# Patient Record
Sex: Male | Born: 1995 | Race: White | Hispanic: No | Marital: Single | Smoking: Never smoker
Health system: Southern US, Community
[De-identification: ages and names within clinical notes are randomized; demographics above are authoritative.]

## PROBLEM LIST (undated history)

## (undated) DIAGNOSIS — T7840XA Allergy, unspecified, initial encounter: Secondary | ICD-10-CM

## (undated) DIAGNOSIS — G4730 Sleep apnea, unspecified: Secondary | ICD-10-CM

## (undated) DIAGNOSIS — K219 Gastro-esophageal reflux disease without esophagitis: Secondary | ICD-10-CM

## (undated) DIAGNOSIS — Z973 Presence of spectacles and contact lenses: Secondary | ICD-10-CM

## (undated) DIAGNOSIS — J029 Acute pharyngitis, unspecified: Secondary | ICD-10-CM

## (undated) HISTORY — DX: Sleep apnea, unspecified: G47.30

## (undated) HISTORY — PX: HERNIA REPAIR: SHX51

## (undated) HISTORY — DX: Allergy, unspecified, initial encounter: T78.40XA

## (undated) HISTORY — PX: FRACTURE SURGERY: SHX138

---
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
## 2015-12-26 ENCOUNTER — Encounter: Payer: Self-pay | Admitting: Family Medicine

## 2015-12-26 ENCOUNTER — Ambulatory Visit (INDEPENDENT_AMBULATORY_CARE_PROVIDER_SITE_OTHER): Payer: 59 | Admitting: Family Medicine

## 2015-12-26 VITALS — BP 122/80 | HR 80 | Ht 70.0 in | Wt 222.0 lb

## 2015-12-26 DIAGNOSIS — R5383 Other fatigue: Secondary | ICD-10-CM | POA: Diagnosis not present

## 2015-12-26 DIAGNOSIS — J029 Acute pharyngitis, unspecified: Secondary | ICD-10-CM | POA: Diagnosis not present

## 2015-12-26 DIAGNOSIS — R5381 Other malaise: Secondary | ICD-10-CM

## 2015-12-26 LAB — POCT INFLUENZA A/B
Influenza A, POC: NEGATIVE
Influenza B, POC: NEGATIVE

## 2015-12-26 MED ORDER — DOXYCYCLINE HYCLATE 100 MG PO TABS: 100.0000 mg | ORAL_TABLET | Freq: Two times a day (BID) | ORAL | Status: DC

## 2015-12-26 NOTE — Progress Notes (Signed)
Name: Randy Farrell   MRN: [id number]    DOB: [birth year]   Date:12/26/2015       Progress Note  Subjective  Chief Complaint  Chief Complaint  Patient presents with  . Sore Throat    taking allergy med- not helping/ nasal congestion, clear production, sweating at night    Sore Throat  This is a new problem. The current episode started in the past 7 days. The problem has been gradually improving. The maximum temperature recorded prior to his arrival was 100.4 - 100.9 F. Associated symptoms include congestion. Pertinent negatives include no abdominal pain, coughing, diarrhea, ear discharge, ear pain, headaches, hoarse voice, plugged ear sensation, neck pain, shortness of breath, stridor, swollen glands, trouble swallowing or vomiting. He has had no exposure to strep or mono. He has tried nothing for the symptoms. The treatment provided mild relief.  Thyroid Problem Presents for initial visit. Symptoms include fatigue. Patient reports no anxiety, cold intolerance, constipation, depressed mood, diarrhea, hair loss, heat intolerance, hoarse voice, leg swelling, menstrual problem, nail problem, palpitations, tremors, visual change, weight gain or weight loss. The symptoms have been stable. Past treatments include nothing. There is no history of atrial fibrillation, dementia, Graves' ophthalmopathy, heart failure, hyperlipidemia, neuropathy, obesity or osteopenia.    No problem-specific assessment & plan notes found for this encounter.   History reviewed. No pertinent past medical history.  Past Surgical History  Procedure Laterality Date  . Foot fracture surgery Right     History reviewed. No pertinent family history.  Social History   Social History  . Marital Status: Unknown    Spouse Name: N/A  . Number of Children: N/A  . Years of Education: N/A   Occupational History  . Not on file.   Social History Main Topics  . Smoking status: Never Smoker   . Smokeless tobacco: Not on  file  . Alcohol Use: No  . Drug Use: No  . Sexual Activity: Yes   Other Topics Concern  . Not on file   Social History Narrative  . No narrative on file    No Known Allergies   Review of Systems  Constitutional: Positive for fatigue. Negative for fever, chills, weight loss, weight gain and malaise/fatigue.  HENT: Positive for congestion. Negative for ear discharge, ear pain, hoarse voice, sore throat and trouble swallowing.   Eyes: Negative for blurred vision.  Respiratory: Negative for cough, sputum production, shortness of breath, wheezing and stridor.   Cardiovascular: Negative for chest pain, palpitations and leg swelling.  Gastrointestinal: Negative for heartburn, nausea, vomiting, abdominal pain, diarrhea, constipation, blood in stool and melena.  Genitourinary: Negative for dysuria, urgency, frequency, hematuria and menstrual problem.  Musculoskeletal: Negative for myalgias, back pain, joint pain and neck pain.  Skin: Negative for itching and rash.  Neurological: Negative for dizziness, tingling, tremors, sensory change, focal weakness and headaches.  Endo/Heme/Allergies: Negative for environmental allergies, cold intolerance, heat intolerance and polydipsia. Does not bruise/bleed easily.  Psychiatric/Behavioral: Negative for depression and suicidal ideas. The patient is not nervous/anxious and does not have insomnia.      Objective  Filed Vitals:   12/26/15 1423  BP: 122/80  Pulse: 80  Height: 5\' 10"  (1.778 m)  Weight: 222 lb (100.699 kg)    Physical Exam  Constitutional: He is oriented to person, place, and time and well-developed, well-nourished, and in no distress.  HENT:  Head: Normocephalic.  Right Ear: Tympanic membrane, external ear and ear canal normal.  Left Ear: Tympanic membrane, external  ear and ear canal normal.  Nose: Nose normal. Right sinus exhibits no maxillary sinus tenderness and no frontal sinus tenderness. Left sinus exhibits no maxillary  sinus tenderness and no frontal sinus tenderness.  Mouth/Throat: Posterior oropharyngeal erythema present. No posterior oropharyngeal edema or tonsillar abscesses.  Eyes: Conjunctivae and EOM are normal. Pupils are equal, round, and reactive to light. Right eye exhibits no discharge. Left eye exhibits no discharge. No scleral icterus.  Neck: Normal range of motion. Neck supple. No JVD present. No tracheal deviation present. No thyromegaly present.  Cardiovascular: Normal rate, regular rhythm, normal heart sounds and intact distal pulses.  Exam reveals no gallop and no friction rub.   No murmur heard. Pulmonary/Chest: Breath sounds normal. No respiratory distress. He has no wheezes. He has no rales.  Abdominal: Soft. Bowel sounds are normal. He exhibits no mass. There is no hepatosplenomegaly, splenomegaly or hepatomegaly. There is no tenderness. There is no rebound, no guarding and no CVA tenderness.  Musculoskeletal: Normal range of motion. He exhibits no edema or tenderness.  Lymphadenopathy:       Head (right side): No submental and no submandibular adenopathy present.       Head (left side): No submental and no submandibular adenopathy present.    He has cervical adenopathy.       Right cervical: Posterior cervical adenopathy present.       Left cervical: Posterior cervical adenopathy present.  Neurological: He is alert and oriented to person, place, and time. He has normal sensation, normal strength, normal reflexes and intact cranial nerves. No cranial nerve deficit.  Skin: Skin is warm. No rash noted.  Psychiatric: Mood and affect normal.  Nursing note and vitals reviewed.     Assessment & Plan  Problem List Items Addressed This Visit    None    Visit Diagnoses    Pharyngitis    -  Primary    Relevant Medications    doxycycline (VIBRA-TABS) 100 MG tablet    Other Relevant Orders    POCT Influenza A/B (Completed)    Malaise and fatigue        Relevant Medications     doxycycline (VIBRA-TABS) 100 MG tablet    Other Relevant Orders    Monospot    HIV antibody    POCT Influenza A/B (Completed)         Dr. Hayden [facility]  12/26/2015

## 2015-12-27 ENCOUNTER — Telehealth: Payer: Self-pay

## 2015-12-27 LAB — HIV ANTIBODY (ROUTINE TESTING W REFLEX): HIV Screen 4th Generation wRfx: NONREACTIVE

## 2015-12-27 LAB — MONONUCLEOSIS SCREEN: Mono Screen: NEGATIVE

## 2015-12-27 NOTE — Telephone Encounter (Signed)
Spoke with patient. Patient advised of all results and verbalized understanding. Will call back with any future questions or concerns. MAH  

## 2015-12-27 NOTE — Telephone Encounter (Signed)
-----   Message from Duanne Limerickeanna C Jones, MD sent at 12/27/2015  8:00 AM EDT ----- HIV negative/ awaiting mono

## 2015-12-27 NOTE — Telephone Encounter (Signed)
-----   Message from Duanne Limerickeanna C Jones, MD sent at 12/27/2015  4:31 PM EDT ----- No mononucleosis

## 2017-09-15 ENCOUNTER — Other Ambulatory Visit: Payer: Self-pay

## 2017-09-15 ENCOUNTER — Ambulatory Visit
Admission: EM | Admit: 2017-09-15 | Discharge: 2017-09-15 | Disposition: A | Discharge status: Home / Self Care | Payer: BLUE CROSS/BLUE SHIELD | Attending: Family Medicine | Admitting: Family Medicine

## 2017-09-15 ENCOUNTER — Ambulatory Visit: Admission: EM | Admit: 2017-09-15 | Discharge: 2017-09-15 | Discharge status: Absent without leave | Payer: Self-pay

## 2017-09-15 DIAGNOSIS — Z7722 Contact with and (suspected) exposure to environmental tobacco smoke (acute) (chronic): Secondary | ICD-10-CM | POA: Insufficient documentation

## 2017-09-15 DIAGNOSIS — R079 Chest pain, unspecified: Secondary | ICD-10-CM | POA: Diagnosis not present

## 2017-09-15 DIAGNOSIS — Z79899 Other long term (current) drug therapy: Secondary | ICD-10-CM | POA: Insufficient documentation

## 2017-09-15 DIAGNOSIS — R042 Hemoptysis: Secondary | ICD-10-CM | POA: Diagnosis not present

## 2017-09-15 DIAGNOSIS — R0789 Other chest pain: Secondary | ICD-10-CM | POA: Insufficient documentation

## 2017-09-15 DIAGNOSIS — K219 Gastro-esophageal reflux disease without esophagitis: Secondary | ICD-10-CM

## 2017-09-15 NOTE — Discharge Instructions (Signed)
Recommend otc PPI, dietary modifications, referrral by PCP to GI for further evaluation/possible endoscopy

## 2017-09-15 NOTE — ED Provider Notes (Signed)
[facility]    CSN: [id number] Arrival date & time: 09/15/17  [id number]     History   Chief Complaint Chief Complaint  Patient presents with  . Chest Pain    HPI Randy Farrell is a 21 y.o. male.   The history is provided by the patient.  Chest Pain  Pain location:  Epigastric and substernal area Pain quality: burning and sharp   Pain radiates to:  Does not radiate Pain severity:  Mild Onset quality:  Sudden Duration:  2 months Timing:  Intermittent Progression:  Waxing and waning Chronicity:  Recurrent Context: not breathing, not drug use, not eating, not intercourse, not lifting, not movement, not raising an arm, not at rest, not stress and not trauma   Relieved by:  Nothing Worsened by:  Nothing Associated symptoms: heartburn   Associated symptoms: no AICD problem, no altered mental status, no anorexia, no anxiety, no back pain, no claudication, no cough, no diaphoresis, no dizziness, no dysphagia, no fatigue, no fever, no headache, no lower extremity edema, no nausea, no near-syncope, no numbness, no orthopnea, no palpitations, no PND, no shortness of breath, no syncope, no vomiting and no weakness   Associated symptoms comment:  States in the past has had episodes of vomiting "a little bit of blood" Risk factors: no aortic disease, no birth control, no coronary artery disease, no diabetes mellitus, no Ehlers-Danlos syndrome, no high cholesterol, no hypertension, no immobilization, not male, no Marfan's syndrome, not obese, not pregnant, no prior DVT/PE, no smoking and no surgery     History reviewed. No pertinent past medical history.  There are no active problems to display for this patient.   Past Surgical History:  Procedure Laterality Date  . FOOT FRACTURE SURGERY Right        Home Medications    Prior to Admission medications   Medication Sig Start Date End Date Taking? Authorizing Provider  doxycycline (VIBRA-TABS) 100 MG tablet Take 1 tablet  (100 mg total) by mouth 2 (two) times daily. 12/26/15   Duanne LimerickJones, Deanna C, MD    Family History History reviewed. No pertinent family history.  Social History Social History   Tobacco Use  . Smoking status: Passive Smoke Exposure - Never Smoker  . Smokeless tobacco: Never Used  Substance Use Topics  . Alcohol use: No    Alcohol/week: 0.0 oz  . Drug use: No     Allergies   Patient has no known allergies.   Review of Systems Review of Systems  Constitutional: Negative for diaphoresis, fatigue and fever.  HENT: Negative for trouble swallowing.   Respiratory: Negative for cough and shortness of breath.   Cardiovascular: Positive for chest pain. Negative for palpitations, orthopnea, claudication, syncope, PND and near-syncope.  Gastrointestinal: Positive for heartburn. Negative for anorexia, nausea and vomiting.  Musculoskeletal: Negative for back pain.  Neurological: Negative for dizziness, weakness, numbness and headaches.     Physical Exam Triage Vital Signs ED Triage Vitals  Enc Vitals Group     BP 09/15/17 0841 127/80     Pulse Rate 09/15/17 0841 77     Resp 09/15/17 0841 18     Temp 09/15/17 0841 98.7 F (37.1 C)     Temp Source 09/15/17 0841 Oral     SpO2 09/15/17 0841 99 %     Weight 09/15/17 0838 230 lb (104.3 kg)     Height 09/15/17 0838 5\' 11"  (1.803 m)     Head Circumference --  Peak Flow --      Pain Score 09/15/17 0838 2     Pain Loc --      Pain Edu? --      Excl. in GC? --    No data found.  Updated Vital Signs BP 127/80 (BP Location: Left Arm)   Pulse 77   Temp 98.7 F (37.1 C) (Oral)   Resp 18   Ht 5\' 11"  (1.803 m)   Wt 230 lb (104.3 kg)   SpO2 99%   BMI 32.08 kg/m   Visual Acuity Right Eye Distance:   Left Eye Distance:   Bilateral Distance:    Right Eye Near:   Left Eye Near:    Bilateral Near:     Physical Exam  Constitutional: He is oriented to person, place, and time. He appears well-developed and well-nourished. No  distress.  HENT:  Head: Normocephalic and atraumatic.  Cardiovascular: Normal rate, regular rhythm, normal heart sounds and intact distal pulses.  No murmur heard. Pulmonary/Chest: Effort normal and breath sounds normal. No stridor. No respiratory distress. He has no wheezes. He has no rales.  Abdominal: Soft. Bowel sounds are normal. He exhibits no distension and no mass. There is tenderness (mild, epigastric; no rebound or guarding). There is no rebound and no guarding.  Neurological: He is alert and oriented to person, place, and time.  Skin: No rash noted. He is not diaphoretic.  Nursing note and vitals reviewed.    UC Treatments / Results  Labs (all labs ordered are listed, but only abnormal results are displayed) Labs Reviewed - No data to display  EKG  EKG Interpretation None       Radiology No results found.  Procedures ED EKG Date/Time: 09/15/2017 10:05 AM Performed by: Payton Mccallumonty, Shantanu Strauch, MD Authorized by: Payton Mccallumonty, Ligaya Cormier, MD   ECG reviewed by ED Physician in the absence of a cardiologist: yes   Previous ECG:    Previous ECG:  Unavailable Interpretation:    Interpretation: normal   Rate:    ECG rate assessment: normal   Rhythm:    Rhythm: sinus rhythm   Ectopy:    Ectopy: none   QRS:    QRS axis:  Normal Conduction:    Conduction: normal   ST segments:    ST segments:  Normal T waves:    T waves: normal      (including critical care time)  Medications Ordered in UC Medications - No data to display   Initial Impression / Assessment and Plan / UC Course  I have reviewed the triage vital signs and the nursing notes.  Pertinent labs & imaging results that were available during my care of the patient were reviewed by me and considered in my medical decision making (see chart for details).       Final Clinical Impressions(s) / UC Diagnoses   Final diagnoses:  Gastroesophageal reflux disease, esophagitis presence not specified  Other chest pain    Hemoptysis    ED Discharge Orders    None     1. ekg result (normal) and diagnosis reviewed with patient 2.. Recommend supportive treatment with otc PPI, dietary modifications, referral by PCP to GI for further evaluation/possible endoscopy 4. To ED if develops hemoptysis recurs or develops melena 5.  Follow-up prn if symptoms worsen or don't improve  Controlled Substance Prescriptions [place] Controlled Substance Registry consulted? Not Applicable   Payton Mccallumonty, Ahlam Piscitelli, MD 09/15/17 1058

## 2017-09-15 NOTE — ED Triage Notes (Signed)
Patient complains of left sided chest dullness x "few years". Patient states that at times he has a sharp pain that will stop him from moving. Patient reports that he has been having a tightness in his upper abdomen with acid reflux recently. Patient reports vomiting blood this morning.

## 2017-09-19 ENCOUNTER — Encounter: Payer: Self-pay | Admitting: Family Medicine

## 2017-09-19 ENCOUNTER — Ambulatory Visit: Payer: BLUE CROSS/BLUE SHIELD | Admitting: Family Medicine

## 2017-09-19 VITALS — BP 138/78 | HR 80 | Ht 71.0 in | Wt 234.0 lb

## 2017-09-19 DIAGNOSIS — K219 Gastro-esophageal reflux disease without esophagitis: Secondary | ICD-10-CM | POA: Diagnosis not present

## 2017-09-19 DIAGNOSIS — R1310 Dysphagia, unspecified: Secondary | ICD-10-CM

## 2017-09-19 DIAGNOSIS — R1319 Other dysphagia: Secondary | ICD-10-CM

## 2017-09-19 MED ORDER — PANTOPRAZOLE SODIUM 40 MG PO TBEC: 40.0000 mg | DELAYED_RELEASE_TABLET | Freq: Every day | ORAL | 3 refills | Status: DC

## 2017-09-19 NOTE — Progress Notes (Signed)
Name: Randy Farrell   MRN: [id number]    DOB: [birth year]   Date:09/19/2017       Progress Note  Subjective  Chief Complaint  Chief Complaint  Patient presents with  . Gastroesophageal Reflux    greasy foods, soda causes burning/ GERD. Feels like food is stuck, but not choking- has tried famotidine- helps some    Gastroesophageal Reflux  He complains of belching, dysphagia, globus sensation, heartburn and a sore throat. He reports no abdominal pain, no chest pain, no choking, no coughing, no early satiety, no hoarse voice, no nausea, no stridor, no tooth decay, no water brash or no wheezing. This is a new problem. The current episode started more than 1 year ago. The problem occurs frequently. The problem has been waxing and waning. The heartburn duration is several minutes. The heartburn is located in the substernum. The symptoms are aggravated by lying down, certain foods and caffeine (soda). Pertinent negatives include no anemia, fatigue, melena, muscle weakness, orthopnea or weight loss. Risk factors include obesity. Treatments tried: fitomine. The treatment provided mild relief.    No problem-specific Assessment & Plan notes found for this encounter.   History reviewed. No pertinent past medical history.  Past Surgical History:  Procedure Laterality Date  . FOOT FRACTURE SURGERY Right     History reviewed. No pertinent family history.  Social History   Socioeconomic History  . Marital status: Unknown    Spouse name: Not on file  . Number of children: Not on file  . Years of education: Not on file  . Highest education level: Not on file  Social Needs  . Financial resource strain: Not on file  . Food insecurity - worry: Not on file  . Food insecurity - inability: Not on file  . Transportation needs - medical: Not on file  . Transportation needs - non-medical: Not on file  Occupational History  . Not on file  Tobacco Use  . Smoking status: Passive Smoke Exposure - Never  Smoker  . Smokeless tobacco: Never Used  Substance and Sexual Activity  . Alcohol use: No    Alcohol/week: 0.0 oz  . Drug use: No  . Sexual activity: Yes  Other Topics Concern  . Not on file  Social History Narrative  . Not on file    No Known Allergies  Outpatient Medications Prior to Visit  Medication Sig Dispense Refill  . doxycycline (VIBRA-TABS) 100 MG tablet Take 1 tablet (100 mg total) by mouth 2 (two) times daily. 20 tablet 0   No facility-administered medications prior to visit.     Review of Systems  Constitutional: Negative for chills, fatigue, fever, malaise/fatigue and weight loss.  HENT: Positive for sore throat. Negative for ear discharge, ear pain and hoarse voice.   Eyes: Negative for blurred vision.  Respiratory: Negative for cough, sputum production, choking, shortness of breath and wheezing.   Cardiovascular: Negative for chest pain, palpitations and leg swelling.  Gastrointestinal: Positive for dysphagia and heartburn. Negative for abdominal pain, blood in stool, constipation, diarrhea, melena and nausea.  Genitourinary: Negative for dysuria, frequency, hematuria and urgency.  Musculoskeletal: Negative for back pain, joint pain, myalgias, muscle weakness and neck pain.  Skin: Negative for rash.  Neurological: Negative for dizziness, tingling, sensory change, focal weakness and headaches.  Endo/Heme/Allergies: Negative for environmental allergies and polydipsia. Does not bruise/bleed easily.  Psychiatric/Behavioral: Negative for depression and suicidal ideas. The patient is not nervous/anxious and does not have insomnia.      Objective  Vitals:   09/19/17 1334  BP: 138/78  Pulse: 80  Weight: 234 lb (106.1 kg)  Height: 5\' 11"  (1.803 m)    Physical Exam  Constitutional: He is oriented to person, place, and time and well-developed, well-nourished, and in no distress.  HENT:  Head: Normocephalic.  Right Ear: External ear normal.  Left Ear: External  ear normal.  Nose: Nose normal.  Mouth/Throat: Oropharynx is clear and moist.  Eyes: Conjunctivae and EOM are normal. Pupils are equal, round, and reactive to light. Right eye exhibits no discharge. Left eye exhibits no discharge. No scleral icterus.  Neck: Normal range of motion. Neck supple. No JVD present. No tracheal deviation present. No thyromegaly present.  Cardiovascular: Normal rate, regular rhythm, normal heart sounds and intact distal pulses. Exam reveals no gallop and no friction rub.  No murmur heard. Pulmonary/Chest: Breath sounds normal. No respiratory distress. He has no wheezes. He has no rales.  Abdominal: Soft. Bowel sounds are normal. He exhibits no mass. There is no hepatosplenomegaly. There is no tenderness. There is no rebound, no guarding and no CVA tenderness.  Musculoskeletal: Normal range of motion. He exhibits no edema or tenderness.  Lymphadenopathy:    He has no cervical adenopathy.  Neurological: He is alert and oriented to person, place, and time. He has normal sensation, normal strength and intact cranial nerves. No cranial nerve deficit.  Skin: Skin is warm. No rash noted.  Psychiatric: Mood and affect normal.  Nursing note and vitals reviewed.     Assessment & Plan  Problem List Items Addressed This Visit    None    Visit Diagnoses    Gastroesophageal reflux disease, esophagitis presence not specified    -  Primary   Relevant Medications   pantoprazole (PROTONIX) 40 MG tablet   Esophageal dysphagia       Relevant Medications   pantoprazole (PROTONIX) 40 MG tablet   Other Relevant Orders   Ambulatory referral to Gastroenterology      Meds ordered this encounter  Medications  . pantoprazole (PROTONIX) 40 MG tablet    Sig: Take 1 tablet (40 mg total) by mouth daily.    Dispense:  30 tablet    Refill:  3      Dr. Hayden Rasmusseneanna Elisa Kutner [facility]  09/19/17

## 2017-09-25 ENCOUNTER — Encounter: Payer: Self-pay | Admitting: Gastroenterology

## 2017-10-27 ENCOUNTER — Encounter: Payer: Self-pay | Admitting: Gastroenterology

## 2017-10-27 ENCOUNTER — Other Ambulatory Visit: Payer: Self-pay

## 2017-10-27 ENCOUNTER — Ambulatory Visit: Payer: BLUE CROSS/BLUE SHIELD | Admitting: Gastroenterology

## 2017-10-27 ENCOUNTER — Encounter (INDEPENDENT_AMBULATORY_CARE_PROVIDER_SITE_OTHER): Payer: Self-pay

## 2017-10-27 VITALS — BP 129/75 | HR 93 | Ht 71.0 in | Wt 238.0 lb

## 2017-10-27 DIAGNOSIS — R1319 Other dysphagia: Secondary | ICD-10-CM

## 2017-10-27 DIAGNOSIS — R1310 Dysphagia, unspecified: Secondary | ICD-10-CM

## 2017-10-27 DIAGNOSIS — K219 Gastro-esophageal reflux disease without esophagitis: Secondary | ICD-10-CM

## 2017-10-27 NOTE — Progress Notes (Signed)
Gastroenterology Consultation  Referring Provider:     Duanne LimerickJones, Deanna C, MD Primary Care Physician:  Duanne LimerickJones, Deanna C, MD Primary Gastroenterologist:  Dr. Servando SnareWohl     Reason for Consultation:     Dysphagia        HPI:   Randy Farrell is a 22 y.o. y/o male referred for consultation & management of dysphagia by Dr. Duanne LimerickJones, Deanna C, MD.  This patient was sent to me by his primary care provider after being seen at the beginning of this year in urgent care for chest pain with heartburn and burping. He reported that the heartburn was associated with a fullness in his throat. The patient had tried a H2 blocker and had some mild relief. The patient was started on Protonix by his primary care provider Dr. Yetta BarreJones. The patient states he hasn't taken the Protonix for 3 days and has not had any heartburn. The patient did report having a black stool last month and he also reports that he vomited once with some blood in it. There is no report of any unexplained weight loss fevers chills or bright red blood per rectum.  No past medical history on file.  Past Surgical History:  Procedure Laterality Date  . FOOT FRACTURE SURGERY Right     Prior to Admission medications   Medication Sig Start Date End Date Taking? Authorizing Provider  pantoprazole (PROTONIX) 40 MG tablet Take 1 tablet (40 mg total) by mouth daily. 09/19/17   Duanne LimerickJones, Deanna C, MD    No family history on file.   Social History   Tobacco Use  . Smoking status: Passive Smoke Exposure - Never Smoker  . Smokeless tobacco: Never Used  Substance Use Topics  . Alcohol use: No    Alcohol/week: 0.0 oz  . Drug use: No    Allergies as of 10/27/2017  . (No Known Allergies)    Review of Systems:    All systems reviewed and negative except where noted in HPI.   Physical Exam:  There were no vitals taken for this visit. No LMP for male patient. Psych:  Alert and cooperative. Normal mood and affect. General:   Alert,  Well-developed,  well-nourished, pleasant and cooperative in NAD Head:  Normocephalic and atraumatic. Eyes:  Sclera clear, no icterus.   Conjunctiva pink. Ears:  Normal auditory acuity. Nose:  No deformity, discharge, or lesions. Mouth:  No deformity or lesions,oropharynx pink & moist. Neck:  Supple; no masses or thyromegaly. Lungs:  Respirations even and unlabored.  Clear throughout to auscultation.   No wheezes, crackles, or rhonchi. No acute distress. Heart:  Regular rate and rhythm; no murmurs, clicks, rubs, or gallops. Abdomen:  Normal bowel sounds.  No bruits.  Soft, non-tender and non-distended without masses, hepatosplenomegaly or hernias noted.  No guarding or rebound tenderness.  Negative Carnett sign.   Rectal:  Deferred.  Msk:  Symmetrical without gross deformities.  Good, equal movement & strength bilaterally. Pulses:  Normal pulses noted. Extremities:  No clubbing or edema.  No cyanosis. Neurologic:  Alert and oriented x3;  grossly normal neurologically. Skin:  Intact without significant lesions or rashes.  No jaundice. Lymph Nodes:  No significant cervical adenopathy. Psych:  Alert and cooperative. Normal mood and affect.  Imaging Studies: No results found.  Assessment and Plan:   Randy Farrell is a 22 y.o. y/o male with dysphagia and heartburn and a history of black stools 1 and a report of vomiting blood. The patient has been told to  continue the Protonix. The patient will also be set up for an EGD to evaluate the cause of black stools and any sign of a esophageal web as the cause of his dysphagia. I have discussed risks & benefits which include, but are not limited to, bleeding, infection, perforation & drug reaction.  The patient agrees with this plan & written consent will be obtained.     Midge Minium, MD. Clementeen Graham   Note: This dictation was prepared with Dragon dictation along with smaller phrase technology. Any transcriptional errors that result from this process are  unintentional.

## 2017-11-04 ENCOUNTER — Encounter: Payer: Self-pay | Admitting: *Deleted

## 2017-11-04 ENCOUNTER — Other Ambulatory Visit: Payer: Self-pay

## 2017-11-06 NOTE — Discharge Instructions (Signed)
General Anesthesia, Adult, Care After °These instructions provide you with information about caring for yourself after your procedure. Your health care provider may also give you more specific instructions. Your treatment has been planned according to current medical practices, but problems sometimes occur. Call your health care provider if you have any problems or questions after your procedure. °What can I expect after the procedure? °After the procedure, it is common to have: °· Vomiting. °· A sore throat. °· Mental slowness. ° °It is common to feel: °· Nauseous. °· Cold or shivery. °· Sleepy. °· Tired. °· Sore or achy, even in parts of your body where you did not have surgery. ° °Follow these instructions at home: °For at least 24 hours after the procedure: °· Do not: °? Participate in activities where you could fall or become injured. °? Drive. °? Use heavy machinery. °? Drink alcohol. °? Take sleeping pills or medicines that cause drowsiness. °? Make important decisions or sign legal documents. °? Take care of children on your own. °· Rest. °Eating and drinking °· If you vomit, drink water, juice, or soup when you can drink without vomiting. °· Drink enough fluid to keep your urine clear or pale yellow. °· Make sure you have little or no nausea before eating solid foods. °· Follow the diet recommended by your health care provider. °General instructions °· Have a responsible adult stay with you until you are awake and alert. °· Return to your normal activities as told by your health care provider. Ask your health care provider what activities are safe for you. °· Take over-the-counter and prescription medicines only as told by your health care provider. °· If you smoke, do not smoke without supervision. °· Keep all follow-up visits as told by your health care provider. This is important. °Contact a health care provider if: °· You continue to have nausea or vomiting at home, and medicines are not helpful. °· You  cannot drink fluids or start eating again. °· You cannot urinate after 8-12 hours. °· You develop a skin rash. °· You have fever. °· You have increasing redness at the site of your procedure. °Get help right away if: °· You have difficulty breathing. °· You have chest pain. °· You have unexpected bleeding. °· You feel that you are having a life-threatening or urgent problem. °This information is not intended to replace advice given to you by your health care provider. Make sure you discuss any questions you have with your health care provider. °Document Released: 01/06/2001 Document Revised: 03/04/2016 Document Reviewed: 09/14/2015 °Elsevier Interactive Patient Education © 2018 [employer] ° °

## 2017-11-07 ENCOUNTER — Ambulatory Visit
Admission: RE | Admit: 2017-11-07 | Discharge: 2017-11-07 | Disposition: A | Discharge status: Home / Self Care | Payer: BLUE CROSS/BLUE SHIELD | Source: Ambulatory Visit | Attending: Gastroenterology | Admitting: Gastroenterology

## 2017-11-07 ENCOUNTER — Ambulatory Visit: Payer: BLUE CROSS/BLUE SHIELD | Admitting: Anesthesiology

## 2017-11-07 ENCOUNTER — Encounter
Admission: RE | Disposition: A | Discharge status: Home / Self Care | Payer: Self-pay | Source: Ambulatory Visit | Attending: Gastroenterology

## 2017-11-07 DIAGNOSIS — K2970 Gastritis, unspecified, without bleeding: Secondary | ICD-10-CM

## 2017-11-07 DIAGNOSIS — K219 Gastro-esophageal reflux disease without esophagitis: Secondary | ICD-10-CM

## 2017-11-07 DIAGNOSIS — Z7722 Contact with and (suspected) exposure to environmental tobacco smoke (acute) (chronic): Secondary | ICD-10-CM | POA: Diagnosis not present

## 2017-11-07 DIAGNOSIS — K228 Other specified diseases of esophagus: Secondary | ICD-10-CM

## 2017-11-07 DIAGNOSIS — K2950 Unspecified chronic gastritis without bleeding: Secondary | ICD-10-CM | POA: Diagnosis not present

## 2017-11-07 DIAGNOSIS — K229 Disease of esophagus, unspecified: Secondary | ICD-10-CM | POA: Diagnosis not present

## 2017-11-07 DIAGNOSIS — K2930 Chronic superficial gastritis without bleeding: Secondary | ICD-10-CM | POA: Diagnosis not present

## 2017-11-07 DIAGNOSIS — R1310 Dysphagia, unspecified: Secondary | ICD-10-CM | POA: Diagnosis not present

## 2017-11-07 HISTORY — DX: Presence of spectacles and contact lenses: Z97.3

## 2017-11-07 HISTORY — PX: ESOPHAGOGASTRODUODENOSCOPY (EGD) WITH PROPOFOL: SHX5813

## 2017-11-07 HISTORY — DX: Gastro-esophageal reflux disease without esophagitis: K21.9

## 2017-11-07 SURGERY — ESOPHAGOGASTRODUODENOSCOPY (EGD) WITH PROPOFOL
Anesthesia: General | Site: Throat | Wound class: Clean Contaminated

## 2017-11-07 MED ORDER — LACTATED RINGERS IV SOLN
1000.0000 mL | INTRAVENOUS | Status: DC
  Administered 2017-11-07: 1000 mL via INTRAVENOUS

## 2017-11-07 MED ORDER — ONDANSETRON HCL 4 MG/2ML IJ SOLN: 4.0000 mg | Freq: Once | INTRAMUSCULAR | Status: DC | PRN

## 2017-11-07 MED ORDER — ACETAMINOPHEN 325 MG PO TABS: 650.0000 mg | ORAL_TABLET | Freq: Once | ORAL | Status: DC | PRN

## 2017-11-07 MED ORDER — STERILE WATER FOR IRRIGATION IR SOLN
Status: DC | PRN
  Administered 2017-11-07: 10:00:00

## 2017-11-07 MED ORDER — GLYCOPYRROLATE 0.2 MG/ML IJ SOLN
INTRAMUSCULAR | Status: DC | PRN
  Administered 2017-11-07: 0.1 mg via INTRAVENOUS

## 2017-11-07 MED ORDER — LIDOCAINE HCL (CARDIAC) 20 MG/ML IV SOLN
INTRAVENOUS | Status: DC | PRN
  Administered 2017-11-07: 30 mg via INTRAVENOUS

## 2017-11-07 MED ORDER — PROPOFOL 10 MG/ML IV BOLUS
INTRAVENOUS | Status: DC | PRN
  Administered 2017-11-07: 25 mg via INTRAVENOUS
  Administered 2017-11-07: 100 mg via INTRAVENOUS
  Administered 2017-11-07: 50 mg via INTRAVENOUS
  Administered 2017-11-07: 25 mg via INTRAVENOUS

## 2017-11-07 MED ORDER — LACTATED RINGERS IV SOLN
INTRAVENOUS | Status: DC
  Administered 2017-11-07: 10:00:00 via INTRAVENOUS

## 2017-11-07 MED ORDER — ACETAMINOPHEN 160 MG/5ML PO SOLN: 325.0000 mg | ORAL | Status: DC | PRN

## 2017-11-07 SURGICAL SUPPLY — 33 items
BALLN DILATOR 10-12 8 (BALLOONS)
BALLN DILATOR 12-15 8 (BALLOONS)
BALLN DILATOR 15-18 8 (BALLOONS)
BALLN DILATOR CRE 0-12 8 (BALLOONS)
BALLN DILATOR ESOPH 8 10 CRE (MISCELLANEOUS) IMPLANT
BALLOON DILATOR 12-15 8 (BALLOONS) IMPLANT
BALLOON DILATOR 15-18 8 (BALLOONS) IMPLANT
BALLOON DILATOR CRE 0-12 8 (BALLOONS) IMPLANT
BLOCK BITE 60FR ADLT L/F GRN (MISCELLANEOUS) ×2 IMPLANT
CANISTER SUCT 1200ML W/VALVE (MISCELLANEOUS) ×2 IMPLANT
CLIP HMST 235XBRD CATH ROT (MISCELLANEOUS) IMPLANT
CLIP RESOLUTION 360 11X235 (MISCELLANEOUS)
ELECT REM PT RETURN 9FT ADLT (ELECTROSURGICAL)
ELECTRODE REM PT RTRN 9FT ADLT (ELECTROSURGICAL) IMPLANT
FCP ESCP3.2XJMB 240X2.8X (MISCELLANEOUS)
FORCEPS BIOP RAD 4 LRG CAP 4 (CUTTING FORCEPS) IMPLANT
FORCEPS BIOP RJ4 240 W/NDL (MISCELLANEOUS)
FORCEPS ESCP3.2XJMB 240X2.8X (MISCELLANEOUS) IMPLANT
GOWN CVR UNV OPN BCK APRN NK (MISCELLANEOUS) ×2 IMPLANT
GOWN ISOL THUMB LOOP REG UNIV (MISCELLANEOUS) ×2
INJECTOR VARIJECT VIN23 (MISCELLANEOUS) IMPLANT
KIT DEFENDO VALVE AND CONN (KITS) IMPLANT
KIT ENDO PROCEDURE OLY (KITS) ×2 IMPLANT
MARKER SPOT ENDO TATTOO 5ML (MISCELLANEOUS) IMPLANT
RETRIEVER NET PLAT FOOD (MISCELLANEOUS) IMPLANT
SNARE SHORT THROW 13M SML OVAL (MISCELLANEOUS) IMPLANT
SNARE SHORT THROW 30M LRG OVAL (MISCELLANEOUS) IMPLANT
SPOT EX ENDOSCOPIC TATTOO (MISCELLANEOUS)
SYR INFLATION 60ML (SYRINGE) IMPLANT
TRAP ETRAP POLY (MISCELLANEOUS) IMPLANT
VARIJECT INJECTOR VIN23 (MISCELLANEOUS)
WATER STERILE IRR 250ML POUR (IV SOLUTION) ×2 IMPLANT
WIRE CRE 18-20MM 8CM F G (MISCELLANEOUS) IMPLANT

## 2017-11-07 NOTE — Transfer of Care (Signed)
Immediate Anesthesia Transfer of Care Note  Patient: Randy Farrell  Procedure(s) Performed: ESOPHAGOGASTRODUODENOSCOPY (EGD) WITH PROPOFOL (N/A Throat)  Patient Location: PACU  Anesthesia Type: General  Level of Consciousness: awake, alert  and patient cooperative  Airway and Oxygen Therapy: Patient Spontanous Breathing and Patient connected to supplemental oxygen  Post-op Assessment: Post-op Vital signs reviewed, Patient's Cardiovascular Status Stable, Respiratory Function Stable, Patent Airway and No signs of Nausea or vomiting  Post-op Vital Signs: Reviewed and stable  Complications: No apparent anesthesia complications

## 2017-11-07 NOTE — Anesthesia Postprocedure Evaluation (Signed)
Anesthesia Post Note  Patient: Randy Farrell  Procedure(s) Performed: ESOPHAGOGASTRODUODENOSCOPY (EGD) WITH PROPOFOL (N/A Throat)  Patient location during evaluation: PACU Anesthesia Type: General Level of consciousness: awake and alert, oriented and patient cooperative Pain management: pain level controlled Vital Signs Assessment: post-procedure vital signs reviewed and stable Respiratory status: spontaneous breathing, nonlabored ventilation and respiratory function stable Cardiovascular status: blood pressure returned to baseline and stable Postop Assessment: adequate PO intake Anesthetic complications: no    Reed BreechAndrea Jajaira Ruis

## 2017-11-07 NOTE — H&P (Signed)
   Randy Miniumarren Illeana Edick, MD [facility] [address] [place], [place] [address] Phone:[phone] Fax : [phone]  Primary Care Physician:  Randy LimerickJones, Deanna C, MD Primary Gastroenterologist:  Dr. Servando SnareWohl  Pre-Procedure History & Physical: HPI:  Randy Farrell is a 22 y.o. male is here for an endoscopy.   Past Medical History:  Diagnosis Date  . GERD (gastroesophageal reflux disease)   . Wears contact lenses     Past Surgical History:  Procedure Laterality Date  . FOOT FRACTURE SURGERY Right 05/2013  . HERNIA REPAIR     as infant    Prior to Admission medications   Medication Sig Start Date End Date Taking? Authorizing Provider  pantoprazole (PROTONIX) 40 MG tablet Take 1 tablet (40 mg total) by mouth daily. 09/19/17  Yes Randy LimerickJones, Deanna C, MD    Allergies as of 10/27/2017  . (No Known Allergies)    History reviewed. No pertinent family history.  Social History   Socioeconomic History  . Marital status: Unknown    Spouse name: Not on file  . Number of children: Not on file  . Years of education: Not on file  . Highest education level: Not on file  Social Needs  . Financial resource strain: Not on file  . Food insecurity - worry: Not on file  . Food insecurity - inability: Not on file  . Transportation needs - medical: Not on file  . Transportation needs - non-medical: Not on file  Occupational History  . Not on file  Tobacco Use  . Smoking status: Passive Smoke Exposure - Never Smoker  . Smokeless tobacco: Never Used  Substance and Sexual Activity  . Alcohol use: No    Alcohol/week: 0.0 oz  . Drug use: No  . Sexual activity: Yes  Other Topics Concern  . Not on file  Social History Narrative  . Not on file    Review of Systems: See HPI, otherwise negative ROS  Physical Exam: BP 124/68   Pulse 73   Temp 98.6 F (37 Farrell) (Temporal)   Resp 16   Ht 5\' 11"  (1.803 m)   Wt 236 lb (107 kg)   SpO2 98%   BMI 32.92 kg/m  General:   Alert,  pleasant and  cooperative in NAD Head:  Normocephalic and atraumatic. Neck:  Supple; no masses or thyromegaly. Lungs:  Clear throughout to auscultation.    Heart:  Regular rate and rhythm. Abdomen:  Soft, nontender and nondistended. Normal bowel sounds, without guarding, and without rebound.   Neurologic:  Alert and  oriented x4;  grossly normal neurologically.  Impression/Plan: Randy Farrell is here for an endoscopy to be performed for dysphagia  Risks, benefits, limitations, and alternatives regarding  endoscopy have been reviewed with the patient.  Questions have been answered.  All parties agreeable.   Randy Miniumarren Danial Sisley, MD  11/07/2017, 9:28 AM

## 2017-11-07 NOTE — Op Note (Signed)
[facility] Gastroenterology Patient Name: Randy Farrell Procedure Date: 11/07/2017 9:54 AM MRN: [id number] Account #: [id number] Date of Birth: 1995-11-20 Admit Type: Outpatient Age: 22 Room: [facility] OR ROOM 01 Gender: Male Note Status: Finalized Procedure:            Upper GI endoscopy Indications:          Dysphagia Providers:            Midge Minium MD, MD Referring MD:         Duanne Limerick, MD (Referring MD) Medicines:            Propofol per Anesthesia Complications:        No immediate complications. Procedure:            Pre-Anesthesia Assessment:                       - Prior to the procedure, a History and Physical was                        performed, and patient medications and allergies were                        reviewed. The patient's tolerance of previous                        anesthesia was also reviewed. The risks and benefits of                        the procedure and the sedation options and risks were                        discussed with the patient. All questions were                        answered, and informed consent was obtained. Prior                        Anticoagulants: The patient has taken no previous                        anticoagulant or antiplatelet agents. ASA Grade                        Assessment: II - A patient with mild systemic disease.                        After reviewing the risks and benefits, the patient was                        deemed in satisfactory condition to undergo the                        procedure.                       After obtaining informed consent, the endoscope was                        passed under direct vision. Throughout the procedure,  the patient's blood pressure, pulse, and oxygen                        saturations were monitored continuously. The Olympus                        [id number]-[id number] Endoscope (S#. [id number] [phone]) was introduced                        through the  mouth, and advanced to the second part of                        duodenum. The upper GI endoscopy was accomplished                        without difficulty. The patient tolerated the procedure                        well. Findings:      The Z-line was irregular and was found at the gastroesophageal junction.       Two biopsies were obtained with cold forceps for histology randomly in       the middle third of the esophagus.      Localized mild inflammation characterized by erythema was found in the       gastric antrum. Biopsies were taken with a cold forceps for histology.      The examined duodenum was normal. Impression:           - Z-line irregular, at the gastroesophageal junction.                       - Gastritis. Biopsied.                       - Normal examined duodenum.                       - Two biopsies were obtained in the middle third of the                        esophagus. Recommendation:       - Discharge patient to home.                       - Resume previous diet.                       - Continue present medications.                       - Await pathology results. Procedure Code(s):    --- Professional ---                       [phone], Esophagogastroduodenoscopy, flexible, transoral;                        with biopsy, single or multiple Diagnosis Code(s):    --- Professional ---                       R13.10, Dysphagia, unspecified  K22.8, Other specified diseases of esophagus                       K29.70, Gastritis, unspecified, without bleeding CPT copyright 2016 American Medical Association. All rights reserved. The codes documented in this report are preliminary and upon coder review may  be revised to meet current compliance requirements. Midge Miniumarren Amare Kontos MD, MD 11/07/2017 10:08:10 AM This report has been signed electronically. Number of Addenda: 0 Note Initiated On: 11/07/2017 9:54 AM      [facility]

## 2017-11-07 NOTE — Anesthesia Procedure Notes (Signed)
Procedure Name: MAC Performed by: Lind Guest, CRNA Pre-anesthesia Checklist: Patient identified, Emergency Drugs available, Suction available, Patient being monitored and Timeout performed Patient Re-evaluated:Patient Re-evaluated prior to induction Oxygen Delivery Method: Nasal cannula

## 2017-11-07 NOTE — Anesthesia Preprocedure Evaluation (Signed)
Anesthesia Evaluation  Patient identified by MRN, date of birth, ID band Patient awake    Reviewed: Allergy & Precautions, NPO status , Patient's Chart, lab work & pertinent test results  History of Anesthesia Complications Negative for: history of anesthetic complications  Airway Mallampati: I  TM Distance: >3 FB Neck ROM: Full    Dental no notable dental hx.    Pulmonary  Snoring    Pulmonary exam normal breath sounds clear to auscultation       Cardiovascular Exercise Tolerance: Good negative cardio ROS Normal cardiovascular exam Rhythm:Regular Rate:Normal     Neuro/Psych negative neurological ROS     GI/Hepatic GERD  ,Dysphagia    Endo/Other  negative endocrine ROS  Renal/GU negative Renal ROS     Musculoskeletal   Abdominal   Peds  Hematology negative hematology ROS (+)   Anesthesia Other Findings   Reproductive/Obstetrics                             Anesthesia Physical Anesthesia Plan  ASA: II  Anesthesia Plan: General   Post-op Pain Management:    Induction: Intravenous  PONV Risk Score and Plan: 2 and Propofol infusion and TIVA  Airway Management Planned: Natural Airway  Additional Equipment:   Intra-op Plan:   Post-operative Plan: Extubation in OR  Informed Consent: I have reviewed the patients History and Physical, chart, labs and discussed the procedure including the risks, benefits and alternatives for the proposed anesthesia with the patient or authorized representative who has indicated his/her understanding and acceptance.     Plan Discussed with: CRNA  Anesthesia Plan Comments:         Anesthesia Quick Evaluation

## 2017-11-10 ENCOUNTER — Encounter: Payer: Self-pay | Admitting: Gastroenterology

## 2017-11-11 ENCOUNTER — Encounter: Payer: Self-pay | Admitting: Gastroenterology

## 2017-11-12 ENCOUNTER — Encounter: Payer: Self-pay | Admitting: Gastroenterology

## 2018-03-08 ENCOUNTER — Other Ambulatory Visit: Payer: Self-pay | Admitting: Family Medicine

## 2018-03-08 DIAGNOSIS — R1319 Other dysphagia: Secondary | ICD-10-CM

## 2018-03-08 DIAGNOSIS — K219 Gastro-esophageal reflux disease without esophagitis: Secondary | ICD-10-CM

## 2018-03-08 DIAGNOSIS — R1310 Dysphagia, unspecified: Secondary | ICD-10-CM

## 2018-03-26 ENCOUNTER — Ambulatory Visit: Payer: BLUE CROSS/BLUE SHIELD | Admitting: Family Medicine

## 2018-03-30 ENCOUNTER — Encounter: Payer: Self-pay | Admitting: Family Medicine

## 2018-03-30 ENCOUNTER — Ambulatory Visit: Payer: BLUE CROSS/BLUE SHIELD | Admitting: Family Medicine

## 2018-03-30 VITALS — BP 118/70 | HR 80 | Ht 71.0 in | Wt 229.0 lb

## 2018-03-30 DIAGNOSIS — K219 Gastro-esophageal reflux disease without esophagitis: Secondary | ICD-10-CM | POA: Diagnosis not present

## 2018-03-30 DIAGNOSIS — E663 Overweight: Secondary | ICD-10-CM

## 2018-03-30 MED ORDER — PANTOPRAZOLE SODIUM 40 MG PO TBEC: DELAYED_RELEASE_TABLET | ORAL | 3 refills | Status: DC

## 2018-03-30 NOTE — Progress Notes (Signed)
Name: Randy Farrell   MRN: [id number]    DOB: 05/28/1996   Date:03/30/2018       Progress Note  Subjective  Chief Complaint  Chief Complaint  Patient presents with  . Gastroesophageal Reflux    Gastroesophageal Reflux  He reports no abdominal pain, no belching, no chest pain, no choking, no coughing, no dysphagia, no early satiety, no globus sensation, no heartburn, no hoarse voice, no nausea, no sore throat or no wheezing. The current episode started more than 1 month ago. The problem has been gradually improving (no breakthrough). Nothing aggravates the symptoms. Pertinent negatives include no melena or weight loss. There are no known risk factors. He has tried a PPI for the symptoms. The treatment provided moderate relief.    Gastroesophageal reflux disease Controlled Will continue pantoprazole 40 mg q day.    Past Medical History:  Diagnosis Date  . GERD (gastroesophageal reflux disease)   . Wears contact lenses     Past Surgical History:  Procedure Laterality Date  . ESOPHAGOGASTRODUODENOSCOPY (EGD) WITH PROPOFOL N/A 11/07/2017   Procedure: ESOPHAGOGASTRODUODENOSCOPY (EGD) WITH PROPOFOL;  Surgeon: Midge MiniumWohl, Darren, MD;  [facility];  Service: Endoscopy;  Laterality: N/A;  . FOOT FRACTURE SURGERY Right 05/2013  . HERNIA REPAIR     as infant    No family history on file.  Social History   Socioeconomic History  . Marital status: Unknown    Spouse name: Not on file  . Number of children: Not on file  . Years of education: Not on file  . Highest education level: Not on file  Occupational History  . Not on file  Social Needs  . Financial resource strain: Not on file  . Food insecurity:    Worry: Not on file    Inability: Not on file  . Transportation needs:    Medical: Not on file    Non-medical: Not on file  Tobacco Use  . Smoking status: Passive Smoke Exposure - Never Smoker  . Smokeless tobacco: Never Used  Substance and Sexual Activity  .  Alcohol use: No    Alcohol/week: 0.0 oz  . Drug use: No  . Sexual activity: Yes  Lifestyle  . Physical activity:    Days per week: Not on file    Minutes per session: Not on file  . Stress: Not on file  Relationships  . Social connections:    Talks on phone: Not on file    Gets together: Not on file    Attends religious service: Not on file    Active member of club or organization: Not on file    Attends meetings of clubs or organizations: Not on file    Relationship status: Not on file  . Intimate partner violence:    Fear of current or ex partner: Not on file    Emotionally abused: Not on file    Physically abused: Not on file    Forced sexual activity: Not on file  Other Topics Concern  . Not on file  Social History Narrative  . Not on file    No Known Allergies  Outpatient Medications Prior to Visit  Medication Sig Dispense Refill  . pantoprazole (PROTONIX) 40 MG tablet TAKE 1 TABLET(40 MG) BY MOUTH DAILY 30 tablet 0   No facility-administered medications prior to visit.     Review of Systems  Constitutional: Negative for chills, fever, malaise/fatigue and weight loss.  HENT: Negative for ear discharge, ear pain, hoarse voice and sore  throat.   Eyes: Negative for blurred vision.  Respiratory: Negative for cough, sputum production, choking, shortness of breath and wheezing.   Cardiovascular: Negative for chest pain, palpitations and leg swelling.  Gastrointestinal: Negative for abdominal pain, blood in stool, constipation, diarrhea, dysphagia, heartburn, melena and nausea.  Genitourinary: Negative for dysuria, frequency, hematuria and urgency.  Musculoskeletal: Negative for back pain, joint pain, myalgias and neck pain.  Skin: Negative for rash.  Neurological: Negative for dizziness, tingling, sensory change, focal weakness and headaches.  Endo/Heme/Allergies: Negative for environmental allergies and polydipsia. Does not bruise/bleed easily.   Psychiatric/Behavioral: Negative for depression and suicidal ideas. The patient is not nervous/anxious and does not have insomnia.      Objective  Vitals:   03/30/18 0813  BP: 118/70  Pulse: 80  Weight: 229 lb (103.9 kg)  Height: 5\' 11"  (1.803 m)    Physical Exam  Constitutional: He is oriented to person, place, and time.  HENT:  Head: Normocephalic.  Right Ear: External ear normal.  Left Ear: External ear normal.  Nose: Nose normal.  Mouth/Throat: Oropharynx is clear and moist.  Eyes: Pupils are equal, round, and reactive to light. Conjunctivae and EOM are normal. Right eye exhibits no discharge. Left eye exhibits no discharge. No scleral icterus.  Neck: Normal range of motion. Neck supple. No JVD present. No tracheal deviation present. No thyromegaly present.  Cardiovascular: Normal rate, regular rhythm, normal heart sounds and intact distal pulses. Exam reveals no gallop and no friction rub.  No murmur heard. Pulmonary/Chest: Breath sounds normal. No respiratory distress. He has no wheezes. He has no rales.  Abdominal: Soft. Bowel sounds are normal. He exhibits no mass. There is no hepatosplenomegaly. There is no tenderness. There is no rebound, no guarding and no CVA tenderness.  Musculoskeletal: Normal range of motion. He exhibits no edema or tenderness.  Lymphadenopathy:    He has no cervical adenopathy.  Neurological: He is alert and oriented to person, place, and time. He has normal strength and normal reflexes. No cranial nerve deficit.  Skin: Skin is warm. No rash noted.  Nursing note and vitals reviewed.     Assessment & Plan  Problem List Items Addressed This Visit      Digestive   Gastroesophageal reflux disease - Primary    Controlled Will continue pantoprazole 40 mg q day.       Relevant Medications   pantoprazole (PROTONIX) 40 MG tablet    Other Visit Diagnoses    Overweight       Weight loss discussed with diet sheet given.      Meds ordered  this encounter  Medications  . pantoprazole (PROTONIX) 40 MG tablet    Sig: TAKE 1 TABLET(40 MG) BY MOUTH DAILY    Dispense:  90 tablet    Refill:  3    Needs appt   Health risks of being over weight were discussed and patient was counseled on weight loss options and exercise.   Dr. Hayden [facility]  03/30/18

## 2018-03-30 NOTE — Assessment & Plan Note (Signed)
Controlled Will continue pantoprazole 40 mg q day.

## 2018-03-30 NOTE — Patient Instructions (Signed)

## 2018-06-04 DIAGNOSIS — H18829 Corneal disorder due to contact lens, unspecified eye: Secondary | ICD-10-CM | POA: Diagnosis not present

## 2018-06-23 ENCOUNTER — Encounter: Payer: Self-pay | Admitting: Family Medicine

## 2018-06-23 ENCOUNTER — Ambulatory Visit: Payer: BLUE CROSS/BLUE SHIELD | Admitting: Family Medicine

## 2018-06-23 VITALS — BP 120/84 | HR 78 | Ht 71.0 in | Wt 224.0 lb

## 2018-06-23 DIAGNOSIS — K2100 Gastro-esophageal reflux disease with esophagitis, without bleeding: Secondary | ICD-10-CM

## 2018-06-23 DIAGNOSIS — R1013 Epigastric pain: Secondary | ICD-10-CM | POA: Diagnosis not present

## 2018-06-23 DIAGNOSIS — Z23 Encounter for immunization: Secondary | ICD-10-CM

## 2018-06-23 NOTE — Assessment & Plan Note (Signed)
Current Persistent. Continue pantoprazole 40mg  daily.

## 2018-06-23 NOTE — Progress Notes (Signed)
Name: Randy Farrell   MRN: [id number]    DOB: Jul 01, 1996   Date:06/23/2018       Progress Note  Subjective  Chief Complaint  Chief Complaint  Patient presents with  . Gastroesophageal Reflux    after eating- gets a pain in epigastric area. hurts until he makes himself vomit, then he has relief    Gastroesophageal Reflux  He complains of abdominal pain and early satiety. He reports no belching, no chest pain, no choking, no coughing, no dysphagia, no heartburn, no hoarse voice, no nausea, no sore throat, no stridor, no tooth decay, no water brash or no wheezing. This is a recurrent problem. The current episode started more than 1 year ago. The problem has been waxing and waning. The symptoms are aggravated by certain foods (any solid food caserol like/fried foods). Pertinent negatives include no anemia, fatigue, melena, muscle weakness, orthopnea or weight loss. Risk factors include Barrett's esophagus (some possible infection). He has tried an antacid and a PPI (self induced emesis) for the symptoms. The treatment provided moderate relief. Past procedures include an EGD.    Gastroesophageal reflux disease Current Persistent. Continue pantoprazole 40mg  daily.   Past Medical History:  Diagnosis Date  . GERD (gastroesophageal reflux disease)   . Wears contact lenses     Past Surgical History:  Procedure Laterality Date  . ESOPHAGOGASTRODUODENOSCOPY (EGD) WITH PROPOFOL N/A 11/07/2017   Procedure: ESOPHAGOGASTRODUODENOSCOPY (EGD) WITH PROPOFOL;  Surgeon: Midge Minium, MD;  Location: [facility];  Service: Endoscopy;  Laterality: N/A;  . FOOT FRACTURE SURGERY Right 05/2013  . HERNIA REPAIR     as infant    History reviewed. No pertinent family history.  Social History   Socioeconomic History  . Marital status: Unknown    Spouse name: Not on file  . Number of children: Not on file  . Years of education: Not on file  . Highest education level: Not on file  Occupational  History  . Not on file  Social Needs  . Financial resource strain: Not on file  . Food insecurity:    Worry: Not on file    Inability: Not on file  . Transportation needs:    Medical: Not on file    Non-medical: Not on file  Tobacco Use  . Smoking status: Passive Smoke Exposure - Never Smoker  . Smokeless tobacco: Never Used  Substance and Sexual Activity  . Alcohol use: No    Alcohol/week: 0.0 standard drinks  . Drug use: No  . Sexual activity: Yes  Lifestyle  . Physical activity:    Days per week: Not on file    Minutes per session: Not on file  . Stress: Not on file  Relationships  . Social connections:    Talks on phone: Not on file    Gets together: Not on file    Attends religious service: Not on file    Active member of club or organization: Not on file    Attends meetings of clubs or organizations: Not on file    Relationship status: Not on file  . Intimate partner violence:    Fear of current or ex partner: Not on file    Emotionally abused: Not on file    Physically abused: Not on file    Forced sexual activity: Not on file  Other Topics Concern  . Not on file  Social History Narrative  . Not on file    No Known Allergies  Outpatient Medications Prior to Visit  Medication Sig Dispense Refill  . pantoprazole (PROTONIX) 40 MG tablet TAKE 1 TABLET(40 MG) BY MOUTH DAILY 90 tablet 3   No facility-administered medications prior to visit.     Review of Systems  Constitutional: Negative for chills, fatigue, fever, malaise/fatigue and weight loss.  HENT: Negative for ear discharge, ear pain, hoarse voice and sore throat.   Eyes: Negative for blurred vision.  Respiratory: Negative for cough, sputum production, choking, shortness of breath and wheezing.   Cardiovascular: Negative for chest pain, palpitations and leg swelling.  Gastrointestinal: Positive for abdominal pain. Negative for blood in stool, constipation, diarrhea, dysphagia, heartburn, melena and  nausea.  Genitourinary: Negative for dysuria, frequency, hematuria and urgency.  Musculoskeletal: Negative for back pain, joint pain, myalgias, muscle weakness and neck pain.  Skin: Negative for rash.  Neurological: Negative for dizziness, tingling, sensory change, focal weakness and headaches.  Endo/Heme/Allergies: Negative for environmental allergies and polydipsia. Does not bruise/bleed easily.  Psychiatric/Behavioral: Negative for depression and suicidal ideas. The patient is not nervous/anxious and does not have insomnia.      Objective  Vitals:   06/23/18 0929  BP: 120/84  Pulse: 78  Weight: 224 lb (101.6 kg)  Height: 5\' 11"  (1.803 m)    Physical Exam  Constitutional: He is oriented to person, place, and time.  HENT:  Head: Normocephalic.  Right Ear: External ear normal.  Left Ear: External ear normal.  Nose: Nose normal.  Mouth/Throat: Oropharynx is clear and moist.  Eyes: Pupils are equal, round, and reactive to light. Conjunctivae and EOM are normal. Right eye exhibits no discharge. Left eye exhibits no discharge. No scleral icterus.  Neck: Normal range of motion. Neck supple. No JVD present. No tracheal deviation present. No thyromegaly present.  Cardiovascular: Normal rate, regular rhythm, normal heart sounds and intact distal pulses. Exam reveals no gallop and no friction rub.  No murmur heard. Pulmonary/Chest: Breath sounds normal. No respiratory distress. He has no wheezes. He has no rales.  Abdominal: Soft. Bowel sounds are normal. He exhibits no distension and no mass. There is no hepatosplenomegaly. There is no tenderness. There is no rebound, no guarding and no CVA tenderness. No hernia.  Musculoskeletal: Normal range of motion. He exhibits no edema or tenderness.  Lymphadenopathy:    He has no cervical adenopathy.  Neurological: He is alert and oriented to person, place, and time. He has normal strength and normal reflexes. No cranial nerve deficit.  Skin:  Skin is warm. No rash noted.  Nursing note and vitals reviewed.     Assessment & Plan  Problem List Items Addressed This Visit      Digestive   Gastroesophageal reflux disease    Current Persistent. Continue pantoprazole 40mg  daily.       Other Visit Diagnoses    Epigastric pain    -  Primary   Continue s/p endoscopy. Recurrent. Reviewed and discussed in detail endoscopy and pathology report. check hepatic panel and lipase. Evaluate with ultrasound RUQ   Relevant Orders   Hepatic Function Panel (6)   Lipase   US Abdomen Limited RUQ   Influenza vaccine needed       Discussed and administered.   Relevant Orders   Flu Vaccine QUAD 6+ mos PF IM (Fluarix Quad PF) (Completed)      No orders of the defined types were placed in this encounter.     Dr. Hayden [facility]  06/23/18

## 2018-06-24 LAB — HEPATIC FUNCTION PANEL (6)
ALT: 85 IU/L — ABNORMAL HIGH (ref 0–44)
AST: 25 IU/L (ref 0–40)
Albumin: 5.2 g/dL (ref 3.5–5.5)
Alkaline Phosphatase: 101 IU/L (ref 39–117)
Bilirubin Total: 0.5 mg/dL (ref 0.0–1.2)
Bilirubin, Direct: 0.12 mg/dL (ref 0.00–0.40)

## 2018-06-24 LAB — LIPASE: Lipase: 28 U/L (ref 13–78)

## 2018-06-25 ENCOUNTER — Ambulatory Visit
Admission: RE | Admit: 2018-06-25 | Discharge: 2018-06-25 | Disposition: A | Discharge status: Home / Self Care | Payer: BLUE CROSS/BLUE SHIELD | Source: Ambulatory Visit | Attending: Family Medicine | Admitting: Family Medicine

## 2018-06-25 DIAGNOSIS — K8020 Calculus of gallbladder without cholecystitis without obstruction: Secondary | ICD-10-CM | POA: Diagnosis not present

## 2018-06-25 DIAGNOSIS — R1013 Epigastric pain: Secondary | ICD-10-CM | POA: Diagnosis present

## 2018-07-01 ENCOUNTER — Other Ambulatory Visit: Payer: Self-pay

## 2018-07-01 ENCOUNTER — Encounter: Payer: Self-pay | Admitting: *Deleted

## 2018-07-01 DIAGNOSIS — K8020 Calculus of gallbladder without cholecystitis without obstruction: Secondary | ICD-10-CM

## 2018-07-15 ENCOUNTER — Ambulatory Visit: Payer: BLUE CROSS/BLUE SHIELD | Admitting: Surgery

## 2018-07-15 ENCOUNTER — Encounter: Payer: Self-pay | Admitting: Surgery

## 2018-07-15 VITALS — BP 129/75 | HR 74 | Temp 97.4°F | Ht 71.0 in | Wt 222.0 lb

## 2018-07-15 DIAGNOSIS — R1011 Right upper quadrant pain: Secondary | ICD-10-CM | POA: Diagnosis not present

## 2018-07-15 NOTE — Patient Instructions (Addendum)
Your Ultrasound is scheduled for @ 07/20/18 @ 8 am  [facility].  Please do not eat/drink 6 hours prior to having the scan done.  We need for you to have blood work done today @ [facility] lab.   Please see your follow up appointment listed below.    Cholelithiasis Cholelithiasis is also called "gallstones." It is a kind of gallbladder disease. The gallbladder is an organ that stores a liquid (bile) that helps you digest fat. Gallstones may not cause symptoms (may be silent gallstones) until they cause a blockage, and then they can cause pain (gallbladder attack). Follow these instructions at home:  Take over-the-counter and prescription medicines only as told by your doctor.  Stay at a healthy weight.  Eat healthy foods. This includes: ? Eating fewer fatty foods, like fried foods. ? Eating fewer refined carbs (refined carbohydrates). Refined carbs are breads and grains that are highly processed, like white bread and white rice. Instead, choose whole grains like whole-wheat bread and brown rice. ? Eating more fiber. Almonds, fresh fruit, and beans are healthy sources of fiber.  Keep all follow-up visits as told by your doctor. This is important. Contact a doctor if:  You have sudden pain in the upper right side of your belly (abdomen). Pain might spread to your right shoulder or your chest. This may be a sign of a gallbladder attack.  You feel sick to your stomach (are nauseous).  You throw up (vomit).  You have been diagnosed with gallstones that have no symptoms and you get: ? Belly pain. ? Discomfort, burning, or fullness in the upper part of your belly (indigestion). Get help right away if:  You have sudden pain in the upper right side of your belly, and it lasts for more than 2 hours.  You have belly pain that lasts for more than 5 hours.  You have a fever or chills.  You keep feeling sick to your stomach or you keep throwing up.  Your skin  or the whites of your eyes turn yellow (jaundice).  You have dark-colored pee (urine).  You have light-colored poop (stool). Summary  Cholelithiasis is also called "gallstones."  The gallbladder is an organ that stores a liquid (bile) that helps you digest fat.  Silent gallstones are gallstones that do not cause symptoms.  A gallbladder attack may cause sudden pain in the upper right side of your belly. Pain might spread to your right shoulder or your chest. If this happens, contact your doctor.  If you have sudden pain in the upper right side of your belly that lasts for more than 2 hours, get help right away. This information is not intended to replace advice given to you by your health care provider. Make sure you discuss any questions you have with your health care provider. Document Released: 03/18/2008 Document Revised: 06/16/2016 Document Reviewed: 06/16/2016 Elsevier Interactive Patient Education  2017 [employer].

## 2018-07-15 NOTE — Progress Notes (Signed)
Patient ID: Randy Farrell, male   DOB: 1996/04/26, 22 y.o.   MRN: [id number]  HPI Randy Farrell is a 22 y.o. male in consultation at the request of Dr. Yetta Barre.  He has had abdominal pain and early satiety for the last few months. Reports that he experiences right upper quadrant pain that is sharp and gets worse after certain heavy meals.  He does have a history of GERD , EGD pers reviewed some gastritis as well.  He also reports that the only thing that helps with his symptoms is vomiting and then he gets some relief. He did have an ultrasound that I have personally reviewed 2-1/2 weeks ago showing evidence of gallstones, prominence extrahepatic common bile duct.  his LFTs showed increasing the ALT to 85 with a normal alkaline phosphatase and a normal bilirubin. Able to perform more than 4 METS of activity without any shortness of breath or chest pain.  He is never had any abdominal operations before. His Mother had gallbladder issues that responded well to cholecystectomy  HPI  Past Medical History:  Diagnosis Date  . GERD (gastroesophageal reflux disease)   . Wears contact lenses     Past Surgical History:  Procedure Laterality Date  . ESOPHAGOGASTRODUODENOSCOPY (EGD) WITH PROPOFOL N/A 11/07/2017   Procedure: ESOPHAGOGASTRODUODENOSCOPY (EGD) WITH PROPOFOL;  Surgeon: Midge Minium, MD;  Location: [facility];  Service: Endoscopy;  Laterality: N/A;  . FOOT FRACTURE SURGERY Right 05/2013  . HERNIA REPAIR     as infant    Family History  Problem Relation Age of Onset  . Healthy Mother   . Healthy Father   . Brain cancer Maternal Grandmother   . Throat cancer Paternal Grandmother   . Lung cancer Paternal Grandmother     Social History Social History   Tobacco Use  . Smoking status: Passive Smoke Exposure - Never Smoker  . Smokeless tobacco: Never Used  Substance Use Topics  . Alcohol use: No    Alcohol/week: 0.0 standard drinks  . Drug use: No    No Known  Allergies  Current Outpatient Medications  Medication Sig Dispense Refill  . pantoprazole (PROTONIX) 40 MG tablet TAKE 1 TABLET(40 MG) BY MOUTH DAILY 90 tablet 3   No current facility-administered medications for this visit.      Review of Systems Full ROS  was asked and was negative except for the information on the HPI  Physical Exam Blood pressure 129/75, pulse 74, temperature (!) 97.4 F (36.3 C), temperature source Skin, height 5\' 11"  (1.803 m), weight 222 lb (100.7 kg). CONSTITUTIONAL: NAD EYES: Pupils are equal, round, and reactive to light, Sclera are non-icteric. EARS, NOSE, MOUTH AND THROAT: The oropharynx is clear. The oral mucosa is pink and moist. Hearing is intact to voice. LYMPH NODES:  Lymph nodes in the neck are normal. RESPIRATORY:  Lungs are clear. There is normal respiratory effort, with equal breath sounds bilaterally, and without pathologic use of accessory muscles. CARDIOVASCULAR: Heart is regular without murmurs, gallops, or rubs. GI: The abdomen is soft, nontender, and nondistended. There are no palpable masses. There is no hepatosplenomegaly. There are normal bowel sounds in all quadrants. GU: Rectal deferred.   MUSCULOSKELETAL: Normal muscle strength and tone. No cyanosis or edema.   SKIN: Turgor is good and there are no pathologic skin lesions or ulcers. NEUROLOGIC: Motor and sensation is grossly normal. Cranial nerves are grossly intact. PSYCH:  Oriented to person, place and time. Affect is normal.  Data Reviewed  I have personally  reviewed the patient's imaging, laboratory findings and medical records.    Assessment/Plan  22 year old male with symptoms consistent with biliary colic.  Patient does have some prominent common bile duct and mild increase in the ALT.  We will repeat an ultrasound to make sure that this has not worsened and we will also repeat liver enzymes.  If in fact he has a persistent dilation of the common bile duct and  hyperbilirubinemia he will need either an MRCP or ERCP.  At this time we will have to make sure that he does not have any evidence of choledocholithiasis before proceeding with cholecystectomy.  I do think that some of his symptoms are related to his gallbladder disease and therefore a cholecystectomy is indicated. Copy of this report will be sent to the referring provider.  Please note that I have had extensive counseling with the patient Copy of this report will be sent to the referring provider  Sterling Big, MD FACS General Surgeon 07/15/2018, 2:32 PM

## 2018-07-20 ENCOUNTER — Telehealth: Payer: Self-pay | Admitting: *Deleted

## 2018-07-20 ENCOUNTER — Other Ambulatory Visit: Payer: Self-pay

## 2018-07-20 ENCOUNTER — Ambulatory Visit: Payer: BLUE CROSS/BLUE SHIELD

## 2018-07-20 ENCOUNTER — Other Ambulatory Visit
Admission: RE | Admit: 2018-07-20 | Discharge: 2018-07-20 | Disposition: A | Discharge status: Home / Self Care | Payer: BLUE CROSS/BLUE SHIELD | Source: Ambulatory Visit | Attending: Surgery | Admitting: Surgery

## 2018-07-20 DIAGNOSIS — R1011 Right upper quadrant pain: Secondary | ICD-10-CM | POA: Insufficient documentation

## 2018-07-20 LAB — COMPREHENSIVE METABOLIC PANEL
ALT: 36 U/L (ref 0–44)
AST: 24 U/L (ref 15–41)
Albumin: 4.8 g/dL (ref 3.5–5.0)
Alkaline Phosphatase: 85 U/L (ref 38–126)
Anion gap: 9 (ref 5–15)
BUN: 12 mg/dL (ref 6–20)
CO2: 28 mmol/L (ref 22–32)
Calcium: 9.3 mg/dL (ref 8.9–10.3)
Chloride: 104 mmol/L (ref 98–111)
Creatinine, Ser: 0.97 mg/dL (ref 0.61–1.24)
GFR calc Af Amer: >60 mL/min (ref >60)
GFR calc non Af Amer: >60 mL/min (ref >60)
Glucose, Bld: 101 mg/dL — ABNORMAL HIGH (ref 70–99)
Potassium: 3.8 mmol/L (ref 3.5–5.1)
Sodium: 141 mmol/L (ref 135–145)
Total Bilirubin: 0.8 mg/dL (ref 0.3–1.2)
Total Protein: 7.7 g/dL (ref 6.5–8.1)

## 2018-07-20 LAB — CBC WITH DIFFERENTIAL/PLATELET
Basophils Absolute: 0.1 10*3/uL (ref 0–0.1)
Basophils Relative: 1 %
Eosinophils Absolute: 0.1 10*3/uL (ref 0–0.7)
Eosinophils Relative: 1 %
HCT: 45.6 % (ref 40.0–52.0)
Hemoglobin: 15.6 g/dL (ref 13.0–18.0)
Lymphocytes Relative: 28 %
Lymphs Abs: 2.1 10*3/uL (ref 1.0–3.6)
MCH: 29.2 pg (ref 26.0–34.0)
MCHC: 34.2 g/dL (ref 32.0–36.0)
MCV: 85.4 fL (ref 80.0–100.0)
Monocytes Absolute: 0.6 10*3/uL (ref 0.2–1.0)
Monocytes Relative: 8 %
Neutro Abs: 4.8 10*3/uL (ref 1.4–6.5)
Neutrophils Relative %: 62 %
Platelets: 162 10*3/uL (ref 150–440)
RBC: 5.34 MIL/uL (ref 4.40–5.90)
RDW: 13.6 % (ref 11.5–14.5)
WBC: 7.6 10*3/uL (ref 3.8–10.6)

## 2018-07-20 NOTE — Telephone Encounter (Signed)
Left message for patient to call the office back. Patient has a follow up appointment for 07/22/18 to review his ultrasound. Patient did not have his ultrasound and needs to reschedule it and then reschedule his appointment here with Dr.Pabon.

## 2018-07-21 ENCOUNTER — Ambulatory Visit
Admission: RE | Admit: 2018-07-21 | Discharge: 2018-07-21 | Disposition: A | Discharge status: Home / Self Care | Payer: BLUE CROSS/BLUE SHIELD | Source: Ambulatory Visit | Attending: Surgery | Admitting: Surgery

## 2018-07-21 DIAGNOSIS — K8020 Calculus of gallbladder without cholecystitis without obstruction: Secondary | ICD-10-CM | POA: Diagnosis not present

## 2018-07-21 DIAGNOSIS — R1011 Right upper quadrant pain: Secondary | ICD-10-CM | POA: Diagnosis not present

## 2018-07-22 ENCOUNTER — Other Ambulatory Visit: Payer: Self-pay

## 2018-07-22 ENCOUNTER — Encounter: Payer: Self-pay | Admitting: *Deleted

## 2018-07-22 ENCOUNTER — Encounter: Payer: Self-pay | Admitting: Surgery

## 2018-07-22 ENCOUNTER — Ambulatory Visit (INDEPENDENT_AMBULATORY_CARE_PROVIDER_SITE_OTHER): Payer: BLUE CROSS/BLUE SHIELD | Admitting: Surgery

## 2018-07-22 VITALS — BP 131/79 | HR 70 | Temp 97.9°F | Resp 12 | Ht 71.0 in | Wt 222.0 lb

## 2018-07-22 DIAGNOSIS — K8020 Calculus of gallbladder without cholecystitis without obstruction: Secondary | ICD-10-CM | POA: Diagnosis not present

## 2018-07-22 NOTE — Patient Instructions (Addendum)
The patient is aware to call back for any questions or new concerns. After surgery, No lifting over 20 pounds for 4-6 weeks.    Laparoscopic Cholecystectomy Laparoscopic cholecystectomy is surgery to remove the gallbladder. The gallbladder is a pear-shaped organ that lies beneath the liver on the right side of the body. The gallbladder stores bile, which is a fluid that helps the body to digest fats. Cholecystectomy is often done for inflammation of the gallbladder (cholecystitis). This condition is usually caused by a buildup of gallstones (cholelithiasis) in the gallbladder. Gallstones can block the flow of bile, which can result in inflammation and pain. In severe cases, emergency surgery may be required. This procedure is done though small incisions in your abdomen (laparoscopic surgery). A thin scope with a camera (laparoscope) is inserted through one incision. Thin surgical instruments are inserted through the other incisions. In some cases, a laparoscopic procedure may be turned into a type of surgery that is done through a larger incision (open surgery). Tell a health care provider about:  Any allergies you have.  All medicines you are taking, including vitamins, herbs, eye drops, creams, and over-the-counter medicines.  Any problems you or family members have had with anesthetic medicines.  Any blood disorders you have.  Any surgeries you have had.  Any medical conditions you have.  Whether you are pregnant or may be pregnant. What are the risks? Generally, this is a safe procedure. However, problems may occur, including:  Infection.  Bleeding.  Allergic reactions to medicines.  Damage to other structures or organs.  A stone remaining in the common bile duct. The common bile duct carries bile from the gallbladder into the small intestine.  A bile leak from the cyst duct that is clipped when your gallbladder is removed.  What happens before the procedure? Staying  hydrated Follow instructions from your health care provider about hydration, which may include:  Up to 2 hours before the procedure - you may continue to drink clear liquids, such as water, clear fruit juice, black coffee, and plain tea.  Eating and drinking restrictions Follow instructions from your health care provider about eating and drinking, which may include:  8 hours before the procedure - stop eating heavy meals or foods such as meat, fried foods, or fatty foods.  6 hours before the procedure - stop eating light meals or foods, such as toast or cereal.  6 hours before the procedure - stop drinking milk or drinks that contain milk.  2 hours before the procedure - stop drinking clear liquids.  Medicines  Ask your health care provider about: ? Changing or stopping your regular medicines. This is especially important if you are taking diabetes medicines or blood thinners. ? Taking medicines such as aspirin and ibuprofen. These medicines can thin your blood. Do not take these medicines before your procedure if your health care provider instructs you not to.  You may be given antibiotic medicine to help prevent infection. General instructions  Let your health care provider know if you develop a cold or an infection before surgery.  Plan to have someone take you home from the hospital or clinic.  Ask your health care provider how your surgical site will be marked or identified. What happens during the procedure?  To reduce your risk of infection: ? Your health care team will wash or sanitize their hands. ? Your skin will be washed with soap. ? Hair may be removed from the surgical area.  An IV tube may  be inserted into one of your veins.  You will be given one or more of the following: ? A medicine to help you relax (sedative). ? A medicine to make you fall asleep (general anesthetic).  A breathing tube will be placed in your mouth.  Your surgeon will make several small  cuts (incisions) in your abdomen.  The laparoscope will be inserted through one of the small incisions. The camera on the laparoscope will send images to a TV screen (monitor) in the operating room. This lets your surgeon see inside your abdomen.  Air-like gas will be pumped into your abdomen. This will expand your abdomen to give the surgeon more room to perform the surgery.  Other tools that are needed for the procedure will be inserted through the other incisions. The gallbladder will be removed through one of the incisions.  Your common bile duct may be examined. If stones are found in the common bile duct, they may be removed.  After your gallbladder has been removed, the incisions will be closed with stitches (sutures), staples, or skin glue.  Your incisions may be covered with a bandage (dressing). The procedure may vary among health care providers and hospitals. What happens after the procedure?  Your blood pressure, heart rate, breathing rate, and blood oxygen level will be monitored until the medicines you were given have worn off.  You will be given medicines as needed to control your pain.  Do not drive for 24 hours if you were given a sedative. This information is not intended to replace advice given to you by your health care provider. Make sure you discuss any questions you have with your health care provider. Document Released: 09/30/2005 Document Revised: 04/21/2016 Document Reviewed: 03/18/2016 Elsevier Interactive Patient Education  2018 [employer].

## 2018-07-22 NOTE — H&P (View-Only) (Signed)
Outpatient Surgical Follow Up  07/22/2018  Randy Farrell is an 22 y.o. male.   Chief Complaint  Patient presents with  . Follow-up    Cholelithiasisand discuss ultrasound completed 07-21-18    HPI: Randy Farrell is following up for symptomatic cholelithiasis.  I repeated an ultrasound of the right upper quadrant that I have again personally reviewed and compared to the previous one.  There is evidence of cholelithiasis with some mild thickening of the wall.  The previous extrahepatic dilation is no longer seen.  Common bile duct is normal.  I have also repeated the LFTs that now are completely back to normal. He will having some intermittent abdominal discomfort and pain.  He has any fevers any chills no evidence of cholangitis or biliary obstruction.  He is able to perform more than 4 METS of activity without any shortness of breath or chest pain  Past Medical History:  Diagnosis Date  . GERD (gastroesophageal reflux disease)   . Wears contact lenses     Past Surgical History:  Procedure Laterality Date  . ESOPHAGOGASTRODUODENOSCOPY (EGD) WITH PROPOFOL N/A 11/07/2017   Procedure: ESOPHAGOGASTRODUODENOSCOPY (EGD) WITH PROPOFOL;  Surgeon: Lucilla Lame, MD;  Location: [place];  Service: Endoscopy;  Laterality: N/A;  . FOOT FRACTURE SURGERY Right 05/2013  . HERNIA REPAIR     as infant    Family History  Problem Relation Age of Onset  . Healthy Mother   . Healthy Father   . Brain cancer Maternal Grandmother   . Throat cancer Paternal Grandmother   . Lung cancer Paternal Grandmother     Social History:  reports that he is a non-smoker but has been exposed to tobacco smoke. He has never used smokeless tobacco. He reports that he does not drink alcohol or use drugs.  Allergies: No Known Allergies  Medications reviewed.    ROS Full ROS performed and is otherwise negative other than what is stated in HPI   BP 131/79   Pulse 70   Temp 97.9 F (36.6 C) (Skin)    Resp 12   Ht '5\' 11"'$  (1.803 m)   Wt 222 lb (100.7 kg)   SpO2 97%   BMI 30.96 kg/m   Physical Exam     Results for orders placed or performed during the hospital encounter of 07/20/18 (from the past 48 hour(s))  Comprehensive metabolic panel     Status: Abnormal   Collection Time: 07/20/18 12:19 PM  Result Value Ref Range   Sodium 141 135 - 145 mmol/L   Potassium 3.8 3.5 - 5.1 mmol/L   Chloride 104 98 - 111 mmol/L   CO2 28 22 - 32 mmol/L   Glucose, Bld 101 (H) 70 - 99 mg/dL   BUN 12 6 - 20 mg/dL   Creatinine, Ser 0.97 0.61 - 1.24 mg/dL   Calcium 9.3 8.9 - 10.3 mg/dL   Total Protein 7.7 6.5 - 8.1 g/dL   Albumin 4.8 3.5 - 5.0 g/dL   AST 24 15 - 41 U/L   ALT 36 0 - 44 U/L   Alkaline Phosphatase 85 38 - 126 U/L   Total Bilirubin 0.8 0.3 - 1.2 mg/dL   GFR calc non Af Amer >60 >60 mL/min   GFR calc Af Amer >60 >60 mL/min    Comment: (NOTE) The eGFR has been calculated using the CKD EPI equation. This calculation has not been validated in all clinical situations. eGFR's persistently <60 mL/min signify possible Chronic Kidney Disease.    Anion  gap 9 5 - 15    Comment: Performed at [facility], [address], [place],  [address]  CBC with Differential/Platelet     Status: None   Collection Time: 07/20/18 12:19 PM  Result Value Ref Range   WBC 7.6 3.8 - 10.6 K/uL   RBC 5.34 4.40 - 5.90 MIL/uL   Hemoglobin 15.6 13.0 - 18.0 g/dL   HCT 45.6 40.0 - 52.0 %   MCV 85.4 80.0 - 100.0 fL   MCH 29.2 26.0 - 34.0 pg   MCHC 34.2 32.0 - 36.0 g/dL   RDW 13.6 11.5 - 14.5 %   Platelets 162 150 - 440 K/uL   Neutrophils Relative % 62 %   Neutro Abs 4.8 1.4 - 6.5 K/uL   Lymphocytes Relative 28 %   Lymphs Abs 2.1 1.0 - 3.6 K/uL   Monocytes Relative 8 %   Monocytes Absolute 0.6 0.2 - 1.0 K/uL   Eosinophils Relative 1 %   Eosinophils Absolute 0.1 0 - 0.7 K/uL   Basophils Relative 1 %   Basophils Absolute 0.1 0 - 0.1 K/uL    Comment: Performed at [facility], [address] [place], [place] [address]   US Abdomen Limited Ruq  Result Date: 07/21/2018 CLINICAL DATA:  Right upper quadrant pain EXAM: ULTRASOUND ABDOMEN LIMITED RIGHT UPPER QUADRANT COMPARISON:  June 25, 2018 FINDINGS: Gallbladder: Within the gallbladder, there are multiple echogenic foci which move and shadow consistent with cholelithiasis. Largest gallstone measures 1.2 cm in length. There is also sludge within the gallbladder. The gallbladder wall is borderline thickened. There is no appreciable pericholecystic fluid. No sonographic Murphy sign noted by sonographer. Common bile duct: Diameter: 5 mm. No intrahepatic or extrahepatic biliary duct dilatation. Liver: No focal lesion identified. Within normal limits in parenchymal echogenicity. Portal vein is patent on color Doppler imaging with normal direction of blood flow towards the liver. IMPRESSION: Cholelithiasis and sludge in gallbladder. Gallbladder wall is borderline thickened. No pericholecystic fluid. Early acute cholecystitis could present in this manner. In this regard, it may be prudent to consider nuclear medicine hepatobiliary imaging study to assess for cystic duct patency. Study otherwise unremarkable. Electronically Signed   By: Lowella Grip III M.D.   On: 07/21/2018 09:53    Assessment/Plan: Symptomatic cholelithiasis, I do recommend cholecystectomy.The risks, benefits, complications, treatment options, and expected outcomes were discussed with the patient. The possibilities of bleeding, recurrent infection, finding a normal gallbladder, perforation of viscus organs, damage to surrounding structures, bile leak, abscess formation, needing a drain placed, the need for additional procedures, reaction to medication, pulmonary aspiration,  failure to diagnose a condition, the possible need to convert to an open procedure, and creating a complication requiring transfusion or operation were discussed with the patient. The  patient and/or family concurred with the proposed plan, giving informed consent.  I Do think that he is a good candidate for robotic cholecystectomy he is very interested in it. Plan for robotic assisted cholecystectomy   Greater than 50% of the 25 minutes  visit was spent in counseling/coordination of care   Caroleen Hamman, MD [place] Surgeon

## 2018-07-22 NOTE — Progress Notes (Signed)
Patient's surgery has been scheduled for 07-30-18 at [facility] with Dr. Everlene Farrier.  The patient is aware to call the office should he have further questions or if he needs to change his surgery date.

## 2018-07-22 NOTE — Progress Notes (Signed)
Outpatient Surgical Follow Up  07/22/2018  Randy Farrell is an 22 y.o. male.   Chief Complaint  Patient presents with  . Follow-up    Cholelithiasisand discuss ultrasound completed 07-21-18    HPI: Randy Farrell is following up for symptomatic cholelithiasis.  I repeated an ultrasound of the right upper quadrant that I have again personally reviewed and compared to the previous one.  There is evidence of cholelithiasis with some mild thickening of the wall.  The previous extrahepatic dilation is no longer seen.  Common bile duct is normal.  I have also repeated the LFTs that now are completely back to normal. He will having some intermittent abdominal discomfort and pain.  He has any fevers any chills no evidence of cholangitis or biliary obstruction.  He is able to perform more than 4 METS of activity without any shortness of breath or chest pain  Past Medical History:  Diagnosis Date  . GERD (gastroesophageal reflux disease)   . Wears contact lenses     Past Surgical History:  Procedure Laterality Date  . ESOPHAGOGASTRODUODENOSCOPY (EGD) WITH PROPOFOL N/A 11/07/2017   Procedure: ESOPHAGOGASTRODUODENOSCOPY (EGD) WITH PROPOFOL;  Surgeon: Lucilla Lame, MD;  Location: [place];  Service: Endoscopy;  Laterality: N/A;  . FOOT FRACTURE SURGERY Right 05/2013  . HERNIA REPAIR     as infant    Family History  Problem Relation Age of Onset  . Healthy Mother   . Healthy Father   . Brain cancer Maternal Grandmother   . Throat cancer Paternal Grandmother   . Lung cancer Paternal Grandmother     Social History:  reports that he is a non-smoker but has been exposed to tobacco smoke. He has never used smokeless tobacco. He reports that he does not drink alcohol or use drugs.  Allergies: No Known Allergies  Medications reviewed.    ROS Full ROS performed and is otherwise negative other than what is stated in HPI   BP 131/79   Pulse 70   Temp 97.9 F (36.6 C) (Skin)    Resp 12   Ht '5\' 11"'$  (1.803 m)   Wt 222 lb (100.7 kg)   SpO2 97%   BMI 30.96 kg/m   Physical Exam     Results for orders placed or performed during the hospital encounter of 07/20/18 (from the past 48 hour(s))  Comprehensive metabolic panel     Status: Abnormal   Collection Time: 07/20/18 12:19 PM  Result Value Ref Range   Sodium 141 135 - 145 mmol/L   Potassium 3.8 3.5 - 5.1 mmol/L   Chloride 104 98 - 111 mmol/L   CO2 28 22 - 32 mmol/L   Glucose, Bld 101 (H) 70 - 99 mg/dL   BUN 12 6 - 20 mg/dL   Creatinine, Ser 0.97 0.61 - 1.24 mg/dL   Calcium 9.3 8.9 - 10.3 mg/dL   Total Protein 7.7 6.5 - 8.1 g/dL   Albumin 4.8 3.5 - 5.0 g/dL   AST 24 15 - 41 U/L   ALT 36 0 - 44 U/L   Alkaline Phosphatase 85 38 - 126 U/L   Total Bilirubin 0.8 0.3 - 1.2 mg/dL   GFR calc non Af Amer >60 >60 mL/min   GFR calc Af Amer >60 >60 mL/min    Comment: (NOTE) The eGFR has been calculated using the CKD EPI equation. This calculation has not been validated in all clinical situations. eGFR's persistently <60 mL/min signify possible Chronic Kidney Disease.    Anion  gap 9 5 - 15    Comment: Performed at [facility], [address], [place], [place] [address]  CBC with Differential/Platelet     Status: None   Collection Time: 07/20/18 12:19 PM  Result Value Ref Range   WBC 7.6 3.8 - 10.6 K/uL   RBC 5.34 4.40 - 5.90 MIL/uL   Hemoglobin 15.6 13.0 - 18.0 g/dL   HCT 45.6 40.0 - 52.0 %   MCV 85.4 80.0 - 100.0 fL   MCH 29.2 26.0 - 34.0 pg   MCHC 34.2 32.0 - 36.0 g/dL   RDW 13.6 11.5 - 14.5 %   Platelets 162 150 - 440 K/uL   Neutrophils Relative % 62 %   Neutro Abs 4.8 1.4 - 6.5 K/uL   Lymphocytes Relative 28 %   Lymphs Abs 2.1 1.0 - 3.6 K/uL   Monocytes Relative 8 %   Monocytes Absolute 0.6 0.2 - 1.0 K/uL   Eosinophils Relative 1 %   Eosinophils Absolute 0.1 0 - 0.7 K/uL   Basophils Relative 1 %   Basophils Absolute 0.1 0 - 0.1 K/uL    Comment: Performed at [facility], [address] [place], [place] [address]   US Abdomen Limited Ruq  Result Date: 07/21/2018 CLINICAL DATA:  Right upper quadrant pain EXAM: ULTRASOUND ABDOMEN LIMITED RIGHT UPPER QUADRANT COMPARISON:  June 25, 2018 FINDINGS: Gallbladder: Within the gallbladder, there are multiple echogenic foci which move and shadow consistent with cholelithiasis. Largest gallstone measures 1.2 cm in length. There is also sludge within the gallbladder. The gallbladder wall is borderline thickened. There is no appreciable pericholecystic fluid. No sonographic Murphy sign noted by sonographer. Common bile duct: Diameter: 5 mm. No intrahepatic or extrahepatic biliary duct dilatation. Liver: No focal lesion identified. Within normal limits in parenchymal echogenicity. Portal vein is patent on color Doppler imaging with normal direction of blood flow towards the liver. IMPRESSION: Cholelithiasis and sludge in gallbladder. Gallbladder wall is borderline thickened. No pericholecystic fluid. Early acute cholecystitis could present in this manner. In this regard, it may be prudent to consider nuclear medicine hepatobiliary imaging study to assess for cystic duct patency. Study otherwise unremarkable. Electronically Signed   By: Lowella Grip III M.D.   On: 07/21/2018 09:53    Assessment/Plan: Symptomatic cholelithiasis, I do recommend cholecystectomy.The risks, benefits, complications, treatment options, and expected outcomes were discussed with the patient. The possibilities of bleeding, recurrent infection, finding a normal gallbladder, perforation of viscus organs, damage to surrounding structures, bile leak, abscess formation, needing a drain placed, the need for additional procedures, reaction to medication, pulmonary aspiration,  failure to diagnose a condition, the possible need to convert to an open procedure, and creating a complication requiring transfusion or operation were discussed with the patient. The  patient and/or family concurred with the proposed plan, giving informed consent.  I Do think that he is a good candidate for robotic cholecystectomy he is very interested in it. Plan for robotic assisted cholecystectomy   Greater than 50% of the 25 minutes  visit was spent in counseling/coordination of care   Caroleen Hamman, MD [place] Surgeon

## 2018-07-27 ENCOUNTER — Encounter
Admission: RE | Admit: 2018-07-27 | Discharge: 2018-07-27 | Disposition: A | Discharge status: Home / Self Care | Payer: BLUE CROSS/BLUE SHIELD | Source: Ambulatory Visit | Attending: Surgery | Admitting: Surgery

## 2018-07-27 ENCOUNTER — Ambulatory Visit: Payer: Self-pay | Admitting: Surgery

## 2018-07-27 ENCOUNTER — Other Ambulatory Visit: Payer: Self-pay

## 2018-07-27 DIAGNOSIS — J029 Acute pharyngitis, unspecified: Secondary | ICD-10-CM

## 2018-07-27 HISTORY — DX: Acute pharyngitis, unspecified: J02.9

## 2018-07-27 NOTE — Patient Instructions (Signed)
Your procedure is scheduled on: 07-30-18  Report to Same Day Surgery 2nd floor medical mall [facility] Entrance-take elevator on left to 2nd floor.  Check in with surgery information desk.) To find out your arrival time please call [phone] between 1PM - 3PM on 07-29-18  Remember: Instructions that are not followed completely may result in serious medical risk, up to and including death, or upon the discretion of your surgeon and anesthesiologist your surgery may need to be rescheduled.    _x___ 1. Do not eat food after midnight the night before your procedure. You may drink clear liquids up to 2 hours before you are scheduled to arrive at the hospital for your procedure.  Do not drink clear liquids within 2 hours of your scheduled arrival to the hospital.  Clear liquids include  --Water or Apple juice without pulp  --Clear carbohydrate beverage such as ClearFast or Gatorade  --Black Coffee or Clear Tea (No milk, no creamers, do not add anything to the coffee or Tea   ____Ensure clear carbohydrate drink on the way to the hospital for bariatric patients  ____Ensure clear carbohydrate drink 3 hours before surgery for Dr Rutherford Nail patients if physician instructed.   No gum chewing or hard candies.     __x__ 2. No Alcohol for 24 hours before or after surgery.   __x__3. No Smoking or e-cigarettes for 24 prior to surgery.  Do not use any chewable tobacco products for at least 6 hour prior to surgery   ____  4. Bring all medications with you on the day of surgery if instructed.    __x__ 5. Notify your doctor if there is any change in your medical condition     (cold, fever, infections).    x___6. On the morning of surgery brush your teeth with toothpaste and water.  You may rinse your mouth with mouth wash if you wish.  Do not swallow any toothpaste or mouthwash.   Do not wear jewelry, make-up, hairpins, clips or nail polish.  Do not wear lotions, powders, or perfumes. You may wear  deodorant.  Do not shave 48 hours prior to surgery. Men may shave face and neck.  Do not bring valuables to the hospital.    [facility] is not responsible for any belongings or valuables.               Contacts, dentures or bridgework may not be worn into surgery.  Leave your suitcase in the car. After surgery it may be brought to your room.  For patients admitted to the hospital, discharge time is determined by your treatment team.  _  Patients discharged the day of surgery will not be allowed to drive home.  You will need someone to drive you home and stay with you the night of your procedure.    Please read over the following fact sheets that you were given:   [facility] Preparing for Surgery   _x___ TAKE THE FOLLOWING MEDICATION THE MORNING OF SURGERY WITH A SMALL SIP OF WATER. These include:  1. PROTONIX  2. TAKE AN EXTRA PROTONIX THE NIGHT BEFORE YOUR SURGERY  3.  4.  5.  6.  ____Fleets enema or Magnesium Citrate as directed.   ____ Use CHG Soap or sage wipes as directed on instruction sheet   ____ Use inhalers on the day of surgery and bring to hospital day of surgery  ____ Stop Metformin and Janumet 2 days prior to surgery.    ____  Take 1/2 of usual insulin dose the night before surgery and none on the morning surgery.   ____ Follow recommendations from Cardiologist, Pulmonologist or PCP regarding stopping Aspirin, Coumadin, Plavix ,Eliquis, Effient, or Pradaxa, and Pletal.  X____Stop Anti-inflammatories such as Advil, Aleve, Ibuprofen, Motrin, Naproxen, Naprosyn, Goodies powders or aspirin products NOW- OK to take Tylenol    ____ Stop supplements until after surgery.     ____ Bring C-Pap to the hospital.

## 2018-07-28 ENCOUNTER — Ambulatory Visit: Payer: BLUE CROSS/BLUE SHIELD | Admitting: Family Medicine

## 2018-07-28 ENCOUNTER — Encounter: Payer: Self-pay | Admitting: Family Medicine

## 2018-07-28 ENCOUNTER — Telehealth: Payer: Self-pay

## 2018-07-28 VITALS — BP 120/72 | HR 72 | Ht 71.0 in | Wt 220.0 lb

## 2018-07-28 DIAGNOSIS — J0100 Acute maxillary sinusitis, unspecified: Secondary | ICD-10-CM

## 2018-07-28 DIAGNOSIS — J029 Acute pharyngitis, unspecified: Secondary | ICD-10-CM

## 2018-07-28 MED ORDER — AMOXICILLIN-POT CLAVULANATE 875-125 MG PO TABS: 1.0000 | ORAL_TABLET | Freq: Two times a day (BID) | ORAL | 0 refills | Status: DC

## 2018-07-28 NOTE — Progress Notes (Signed)
Date:  07/28/2018   Name:  Randy Farrell   DOB:  [birth year]   MRN:  [id number]   Chief Complaint: Sore Throat (x 1 week/ tonsils feel swollen on both sides- having surgery on Thursday) Sore Throat   This is a new problem. The current episode started in the past 7 days. The problem has been gradually worsening. Neither (bilateral) side of throat is experiencing more pain than the other. There has been no fever. Associated symptoms include congestion, coughing and neck pain. Pertinent negatives include no abdominal pain, diarrhea, drooling, ear discharge, ear pain, headaches, hoarse voice, plugged ear sensation or shortness of breath. He has tried nothing for the symptoms.  Sinusitis  This is a chronic problem. The current episode started yesterday. The problem has been gradually worsening since onset. There has been no fever. Associated symptoms include congestion, coughing, neck pain, sinus pressure and a sore throat. Pertinent negatives include no chills, ear pain, headaches, hoarse voice or shortness of breath. (Yellow postnasal drainage)     Review of Systems  Constitutional: Negative for chills and fever.  HENT: Positive for congestion, sinus pressure and sore throat. Negative for drooling, ear discharge, ear pain and hoarse voice.   Respiratory: Positive for cough. Negative for shortness of breath and wheezing.   Cardiovascular: Negative for chest pain, palpitations and leg swelling.  Gastrointestinal: Negative for abdominal pain, blood in stool, constipation, diarrhea and nausea.  Endocrine: Negative for polydipsia.  Genitourinary: Negative for dysuria, frequency, hematuria and urgency.  Musculoskeletal: Positive for neck pain. Negative for back pain and myalgias.  Skin: Negative for rash.  Allergic/Immunologic: Negative for environmental allergies.  Neurological: Negative for dizziness and headaches.  Hematological: Does not bruise/bleed easily.  Psychiatric/Behavioral:  Negative for suicidal ideas. The patient is not nervous/anxious.     Patient Active Problem List   Diagnosis Date Noted  . Problems with swallowing and mastication   . Gastroesophageal reflux disease   . Gastritis without bleeding   . 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 ESOPHAGOGASTRODUODENOSCOPY (EGD) WITH PROPOFOL N/A 11/07/2017   Procedure: ESOPHAGOGASTRODUODENOSCOPY (EGD) WITH PROPOFOL;  Surgeon: Midge Minium, MD;  Location: [facility];  Service: Endoscopy;  Laterality: N/A;  . FOOT FRACTURE SURGERY Right 05/2013  . HERNIA REPAIR     as infant    Social History   Tobacco Use  . Smoking status: Passive Smoke Exposure - Never Smoker  . Smokeless tobacco: Never Used  Substance Use Topics  . Alcohol use: No    Alcohol/week: 0.0 standard drinks  . Drug use: No     Medication list has been reviewed and updated.  Current Meds  Medication Sig  . pantoprazole (PROTONIX) 40 MG tablet TAKE 1 TABLET(40 MG) BY MOUTH DAILY (Patient taking differently: Take 40 mg by mouth every morning. )    PHQ 2/9 Scores 03/30/2018 12/26/2015  PHQ - 2 Score 0 0  PHQ- 9 Score 0 -    Physical Exam  Constitutional: He is oriented to person, place, and time.  HENT:  Head: Normocephalic.  Right Ear: Tympanic membrane and external ear normal.  Left Ear: Tympanic membrane and external ear normal.  Nose: Nose normal. Right sinus exhibits no maxillary sinus tenderness and no frontal sinus tenderness. Left sinus exhibits no maxillary sinus tenderness and no frontal sinus tenderness.  Mouth/Throat: Uvula is midline. Posterior oropharyngeal erythema present. No oropharyngeal exudate or posterior oropharyngeal edema.  Postnasal dark darainage noted.  Eyes: Pupils are equal, round, and reactive to light. Conjunctivae and EOM are normal. Right eye exhibits no discharge. Left eye exhibits no discharge. No scleral icterus.    Neck: Normal range of motion. Neck supple. No JVD present. No tracheal deviation present. No thyromegaly present.  Cardiovascular: Normal rate, regular rhythm, normal heart sounds and intact distal pulses. Exam reveals no gallop and no friction rub.  No murmur heard. Pulmonary/Chest: Breath sounds normal. No respiratory distress. He has no wheezes. He has no rales.  Abdominal: Soft. Bowel sounds are normal. He exhibits no mass. There is no hepatosplenomegaly. There is no tenderness. There is no rebound, no guarding and no CVA tenderness.  Musculoskeletal: Normal range of motion. He exhibits no edema or tenderness.  Lymphadenopathy:    He has no cervical adenopathy.  Neurological: He is alert and oriented to person, place, and time. He has normal strength and normal reflexes. No cranial nerve deficit.  Skin: Skin is warm. No rash noted.  Nursing note and vitals reviewed.   BP 120/72   Pulse 72   Ht 5\' 11"  (1.803 m)   Wt 220 lb (99.8 kg)   BMI 30.68 kg/m   Assessment and Plan:  1. Acute maxillary sinusitis, recurrence not specified Acute. Surgery scheduled for Thursday. Will start on Augmentin. - amoxicillin-clavulanate (AUGMENTIN) 875-125 MG tablet; Take 1 tablet by mouth 2 (two) times daily.  Dispense: 20 tablet; Refill: 0  2. Pharyngitis, unspecified etiology Associated with above. To call anesthesiolgy.   Dr. Hayden [facility]  07/28/2018

## 2018-07-28 NOTE — Telephone Encounter (Signed)
Patient went to his primary care provider today and was diagnosed with at sinus infection. He has not been running any fever just pressure and nasal drainage. He was placed on Amoxicillin for treatment. He has surgery with Dr Everlene Farrier on 07/30/18.  Spoke with Dr Everlene Farrier and he was fine with doing surgery as long at the patient does not get any worse or starts to develop a fever. Patient notified of such and is aware to call with any changes.

## 2018-07-29 ENCOUNTER — Encounter: Payer: Self-pay | Admitting: *Deleted

## 2018-07-29 MED ORDER — CEFAZOLIN SODIUM-DEXTROSE 2-4 GM/100ML-% IV SOLN
2.0000 g | INTRAVENOUS | Status: AC
  Administered 2018-07-30: 2 g via INTRAVENOUS

## 2018-07-30 ENCOUNTER — Ambulatory Visit: Payer: BLUE CROSS/BLUE SHIELD | Admitting: Anesthesiology

## 2018-07-30 ENCOUNTER — Other Ambulatory Visit: Payer: Self-pay

## 2018-07-30 ENCOUNTER — Encounter: Payer: Self-pay | Admitting: *Deleted

## 2018-07-30 ENCOUNTER — Encounter
Admission: RE | Disposition: A | Discharge status: Home / Self Care | Payer: Self-pay | Source: Ambulatory Visit | Attending: Surgery

## 2018-07-30 ENCOUNTER — Ambulatory Visit
Admission: RE | Admit: 2018-07-30 | Discharge: 2018-07-30 | Disposition: A | Discharge status: Home / Self Care | Payer: BLUE CROSS/BLUE SHIELD | Source: Ambulatory Visit | Attending: Surgery | Admitting: Surgery

## 2018-07-30 DIAGNOSIS — K8020 Calculus of gallbladder without cholecystitis without obstruction: Secondary | ICD-10-CM

## 2018-07-30 DIAGNOSIS — K8064 Calculus of gallbladder and bile duct with chronic cholecystitis without obstruction: Secondary | ICD-10-CM | POA: Insufficient documentation

## 2018-07-30 DIAGNOSIS — K219 Gastro-esophageal reflux disease without esophagitis: Secondary | ICD-10-CM | POA: Diagnosis not present

## 2018-07-30 DIAGNOSIS — Z79899 Other long term (current) drug therapy: Secondary | ICD-10-CM | POA: Insufficient documentation

## 2018-07-30 DIAGNOSIS — K8010 Calculus of gallbladder with chronic cholecystitis without obstruction: Secondary | ICD-10-CM | POA: Diagnosis not present

## 2018-07-30 HISTORY — PX: ROBOTIC ASSISTED LAPAROSCOPIC CHOLECYSTECTOMY-MULTI SITE: SHX6603

## 2018-07-30 HISTORY — DX: Acute pharyngitis, unspecified: J02.9

## 2018-07-30 SURGERY — ROBOTIC ASSISTED LAPAROSCOPIC CHOLECYSTECTOMY-MULTI SITE
Anesthesia: General | Site: Abdomen

## 2018-07-30 MED ORDER — CEFAZOLIN SODIUM-DEXTROSE 2-4 GM/100ML-% IV SOLN
INTRAVENOUS | Status: AC
  Filled 2018-07-30: qty 100

## 2018-07-30 MED ORDER — GABAPENTIN 300 MG PO CAPS
300.0000 mg | ORAL_CAPSULE | ORAL | Status: AC
  Administered 2018-07-30: 300 mg via ORAL

## 2018-07-30 MED ORDER — ACETAMINOPHEN 500 MG PO TABS
ORAL_TABLET | ORAL | Status: AC
  Administered 2018-07-30: 1000 mg via ORAL
  Filled 2018-07-30: qty 2

## 2018-07-30 MED ORDER — INDOCYANINE GREEN 25 MG IV SOLR
7.5000 mg | Freq: Once | INTRAVENOUS | Status: AC
  Administered 2018-07-30: 7.5 mg via INTRAVENOUS
  Filled 2018-07-30: qty 25

## 2018-07-30 MED ORDER — OXYCODONE HCL 5 MG PO TABS
ORAL_TABLET | ORAL | Status: AC
  Filled 2018-07-30: qty 1

## 2018-07-30 MED ORDER — OXYCODONE HCL 5 MG/5ML PO SOLN: 5.0000 mg | Freq: Once | ORAL | Status: AC | PRN

## 2018-07-30 MED ORDER — SUCCINYLCHOLINE CHLORIDE 20 MG/ML IJ SOLN
INTRAMUSCULAR | Status: DC | PRN
  Administered 2018-07-30: 120 mg via INTRAVENOUS

## 2018-07-30 MED ORDER — HYDROCODONE-ACETAMINOPHEN 5-325 MG PO TABS: 1.0000 | ORAL_TABLET | ORAL | 0 refills | Status: DC | PRN

## 2018-07-30 MED ORDER — CELECOXIB 200 MG PO CAPS
ORAL_CAPSULE | ORAL | Status: AC
  Administered 2018-07-30: 200 mg via ORAL
  Filled 2018-07-30: qty 1

## 2018-07-30 MED ORDER — INDOCYANINE GREEN 25 MG IV SOLR
INTRAVENOUS | Status: AC
  Filled 2018-07-30: qty 25

## 2018-07-30 MED ORDER — OXYCODONE HCL 5 MG PO TABS
5.0000 mg | ORAL_TABLET | Freq: Once | ORAL | Status: AC | PRN
  Administered 2018-07-30: 5 mg via ORAL

## 2018-07-30 MED ORDER — LACTATED RINGERS IV SOLN
INTRAVENOUS | Status: DC
  Administered 2018-07-30 (×3): via INTRAVENOUS

## 2018-07-30 MED ORDER — MIDAZOLAM HCL 2 MG/2ML IJ SOLN
INTRAMUSCULAR | Status: DC | PRN
  Administered 2018-07-30: 2 mg via INTRAVENOUS

## 2018-07-30 MED ORDER — SUGAMMADEX SODIUM 200 MG/2ML IV SOLN
INTRAVENOUS | Status: DC | PRN
  Administered 2018-07-30: 200 mg via INTRAVENOUS

## 2018-07-30 MED ORDER — PHENYLEPHRINE HCL 10 MG/ML IJ SOLN
INTRAMUSCULAR | Status: DC | PRN
  Administered 2018-07-30: 100 ug via INTRAVENOUS

## 2018-07-30 MED ORDER — FENTANYL CITRATE (PF) 100 MCG/2ML IJ SOLN
25.0000 ug | INTRAMUSCULAR | Status: DC | PRN
  Administered 2018-07-30 (×3): 25 ug via INTRAVENOUS

## 2018-07-30 MED ORDER — LIDOCAINE HCL (CARDIAC) PF 100 MG/5ML IV SOSY
PREFILLED_SYRINGE | INTRAVENOUS | Status: DC | PRN
  Administered 2018-07-30: 100 mg via INTRAVENOUS

## 2018-07-30 MED ORDER — INDOCYANINE GREEN 25 MG IV SOLR
25.0000 mg | Freq: Once | INTRAVENOUS | Status: DC
  Filled 2018-07-30: qty 25

## 2018-07-30 MED ORDER — ACETAMINOPHEN 500 MG PO TABS
1000.0000 mg | ORAL_TABLET | ORAL | Status: AC
  Administered 2018-07-30: 1000 mg via ORAL

## 2018-07-30 MED ORDER — ROCURONIUM BROMIDE 100 MG/10ML IV SOLN
INTRAVENOUS | Status: DC | PRN
  Administered 2018-07-30: 50 mg via INTRAVENOUS

## 2018-07-30 MED ORDER — GABAPENTIN 300 MG PO CAPS
ORAL_CAPSULE | ORAL | Status: AC
  Administered 2018-07-30: 300 mg via ORAL
  Filled 2018-07-30: qty 1

## 2018-07-30 MED ORDER — ONDANSETRON HCL 4 MG/2ML IJ SOLN
INTRAMUSCULAR | Status: DC | PRN
  Administered 2018-07-30: 4 mg via INTRAVENOUS

## 2018-07-30 MED ORDER — FENTANYL CITRATE (PF) 100 MCG/2ML IJ SOLN
INTRAMUSCULAR | Status: DC | PRN
  Administered 2018-07-30: 50 ug via INTRAVENOUS
  Administered 2018-07-30: 100 ug via INTRAVENOUS

## 2018-07-30 MED ORDER — DEXAMETHASONE SODIUM PHOSPHATE 10 MG/ML IJ SOLN
INTRAMUSCULAR | Status: DC | PRN
  Administered 2018-07-30: 10 mg via INTRAVENOUS

## 2018-07-30 MED ORDER — FENTANYL CITRATE (PF) 100 MCG/2ML IJ SOLN
INTRAMUSCULAR | Status: AC
  Filled 2018-07-30: qty 2

## 2018-07-30 MED ORDER — CHLORHEXIDINE GLUCONATE CLOTH 2 % EX PADS: 6.0000 | MEDICATED_PAD | Freq: Once | CUTANEOUS | Status: DC

## 2018-07-30 MED ORDER — PROMETHAZINE HCL 25 MG/ML IJ SOLN: 6.2500 mg | INTRAMUSCULAR | Status: DC | PRN

## 2018-07-30 MED ORDER — BUPIVACAINE-EPINEPHRINE (PF) 0.25% -1:200000 IJ SOLN
INTRAMUSCULAR | Status: AC
  Filled 2018-07-30: qty 30

## 2018-07-30 MED ORDER — CELECOXIB 200 MG PO CAPS
200.0000 mg | ORAL_CAPSULE | ORAL | Status: AC
  Administered 2018-07-30: 200 mg via ORAL

## 2018-07-30 MED ORDER — PROPOFOL 10 MG/ML IV BOLUS
INTRAVENOUS | Status: DC | PRN
  Administered 2018-07-30: 200 mg via INTRAVENOUS

## 2018-07-30 MED ORDER — MEPERIDINE HCL 50 MG/ML IJ SOLN: 6.2500 mg | INTRAMUSCULAR | Status: DC | PRN

## 2018-07-30 SURGICAL SUPPLY — 41 items
CANISTER SUCT 1200ML W/VALVE (MISCELLANEOUS) ×2 IMPLANT
CHLORAPREP W/TINT 26ML (MISCELLANEOUS) ×2 IMPLANT
CLIP VESOLOCK MED LG 6/CT (CLIP) ×4 IMPLANT
CORD BIP STRL DISP 12FT (MISCELLANEOUS) ×2 IMPLANT
COVER TIP SHEARS 8 DVNC (MISCELLANEOUS) IMPLANT
COVER TIP SHEARS 8MM DA VINCI (MISCELLANEOUS)
COVER WAND RF STERILE (DRAPES) ×2 IMPLANT
DECANTER SPIKE VIAL GLASS SM (MISCELLANEOUS) ×2 IMPLANT
DEFOGGER SCOPE WARMER CLEARIFY (MISCELLANEOUS) ×2 IMPLANT
DERMABOND ADVANCED (GAUZE/BANDAGES/DRESSINGS) ×1
DERMABOND ADVANCED .7 DNX12 (GAUZE/BANDAGES/DRESSINGS) ×1 IMPLANT
DRAPE 3 ARM ACCESS DA VINCI (DRAPES) ×1
DRAPE 3 ARM ACCESS DVNC (DRAPES) ×1 IMPLANT
DRAPE SHEET LG 3/4 BI-LAMINATE (DRAPES) ×2 IMPLANT
ELECT REM PT RETURN 9FT ADLT (ELECTROSURGICAL) ×2
ELECTRODE REM PT RTRN 9FT ADLT (ELECTROSURGICAL) ×1 IMPLANT
GLOVE BIO SURGEON STRL SZ7 (GLOVE) ×4 IMPLANT
GOWN STRL REUS W/ TWL LRG LVL3 (GOWN DISPOSABLE) ×4 IMPLANT
GOWN STRL REUS W/TWL LRG LVL3 (GOWN DISPOSABLE) ×4
IRRIGATION STRYKERFLOW (MISCELLANEOUS) IMPLANT
IRRIGATOR STRYKERFLOW (MISCELLANEOUS)
IV NS 1000ML (IV SOLUTION) ×1
IV NS 1000ML BAXH (IV SOLUTION) ×1 IMPLANT
KIT PINK PAD W/HEAD ARE REST (MISCELLANEOUS) ×2
KIT PINK PAD W/HEAD ARM REST (MISCELLANEOUS) ×1 IMPLANT
LABEL OR SOLS (LABEL) ×2 IMPLANT
NEEDLE HYPO 22GX1.5 SAFETY (NEEDLE) ×2 IMPLANT
NS IRRIG 500ML POUR BTL (IV SOLUTION) ×2 IMPLANT
PACK LAP CHOLECYSTECTOMY (MISCELLANEOUS) ×2 IMPLANT
PENCIL ELECTRO HAND CTR (MISCELLANEOUS) ×2 IMPLANT
POUCH SPECIMEN RETRIEVAL 10MM (ENDOMECHANICALS) ×2 IMPLANT
PROGRASP ENDOWRIST DA VINCI (INSTRUMENTS)
PROGRASP ENDOWRIST DVNC (INSTRUMENTS) IMPLANT
SOLUTION ELECTROLUBE (MISCELLANEOUS) ×2 IMPLANT
SPONGE LAP 18X18 RF (DISPOSABLE) ×2 IMPLANT
STRAP SAFETY 5IN WIDE (MISCELLANEOUS) ×2 IMPLANT
SUT MNCRL AB 4-0 PS2 18 (SUTURE) ×2 IMPLANT
SUT VICRYL 0 AB UR-6 (SUTURE) ×4 IMPLANT
TROCAR 130MM GELPORT  DAV (MISCELLANEOUS) ×2 IMPLANT
TROCAR XCEL NON-BLD 5MMX100MML (ENDOMECHANICALS) ×2 IMPLANT
TUBING INSUF HEATED (TUBING) ×2 IMPLANT

## 2018-07-30 NOTE — Interval H&P Note (Signed)
History and Physical Interval Note:  07/30/2018 2:27 PM  Randy Farrell  has presented today for surgery, with the diagnosis of SYMPTOMATIC CHOLELITHIASIS  The various methods of treatment have been discussed with the patient and family. After consideration of risks, benefits and other options for treatment, the patient has consented to  Procedure(s): ROBOTIC ASSISTED LAPAROSCOPIC CHOLECYSTECTOMY-MULTI SITE (N/A) as a surgical intervention .  The patient's history has been reviewed, patient examined, no change in status, stable for surgery.  I have reviewed the patient's chart and labs.  Questions were answered to the patient's satisfaction.     Diego F Pabon

## 2018-07-30 NOTE — Anesthesia Post-op Follow-up Note (Signed)
Anesthesia QCDR form completed.        

## 2018-07-30 NOTE — Discharge Instructions (Signed)

## 2018-07-30 NOTE — Transfer of Care (Signed)
Immediate Anesthesia Transfer of Care Note  Patient: Randy Farrell  Procedure(s) Performed: ROBOTIC ASSISTED LAPAROSCOPIC CHOLECYSTECTOMY-MULTI SITE (N/A Abdomen)  Patient Location: PACU  Anesthesia Type:General  Level of Consciousness: drowsy and patient cooperative  Airway & Oxygen Therapy: Patient Spontanous Breathing and Patient connected to face mask oxygen  Post-op Assessment: Report given to RN and Post -op Vital signs reviewed and stable  Post vital signs: Reviewed and stable  Last Vitals:  Vitals Value Taken Time  BP 129/76 07/30/2018  4:39 PM  Temp 35.9 C 07/30/2018  4:39 PM  Pulse 93 07/30/2018  4:45 PM  Resp 18 07/30/2018  4:39 PM  SpO2 100 % 07/30/2018  4:45 PM  Vitals shown include unvalidated device data.  Last Pain:  Vitals:   07/30/18 1142  TempSrc: Oral  PainSc: 0-No pain         Complications: No apparent anesthesia complications

## 2018-07-30 NOTE — Anesthesia Preprocedure Evaluation (Signed)
Anesthesia Evaluation  Patient identified by MRN, date of birth, ID band Patient awake    Reviewed: Allergy & Precautions, NPO status , Patient's Chart, lab work & pertinent test results  History of Anesthesia Complications Negative for: history of anesthetic complications  Airway Mallampati: I  TM Distance: >3 FB Neck ROM: Full    Dental no notable dental hx.    Pulmonary neg pulmonary ROS, neg sleep apnea, neg COPD,    breath sounds clear to auscultation- rhonchi (-) wheezing      Cardiovascular Exercise Tolerance: Good (-) hypertension(-) CAD and (-) Past MI  Rhythm:Regular Rate:Normal - Systolic murmurs and - Diastolic murmurs    Neuro/Psych negative neurological ROS  negative psych ROS   GI/Hepatic Neg liver ROS, GERD  ,  Endo/Other  negative endocrine ROSneg diabetes  Renal/GU negative Renal ROS     Musculoskeletal negative musculoskeletal ROS (+)   Abdominal (+) + obese,   Peds  Hematology negative hematology ROS (+)   Anesthesia Other Findings   Reproductive/Obstetrics                             Anesthesia Physical Anesthesia Plan  ASA: II  Anesthesia Plan: General   Post-op Pain Management:    Induction: Intravenous  PONV Risk Score and Plan: 1 and Ondansetron and Midazolam  Airway Management Planned: Oral ETT  Additional Equipment:   Intra-op Plan:   Post-operative Plan: Extubation in OR  Informed Consent: I have reviewed the patients History and Physical, chart, labs and discussed the procedure including the risks, benefits and alternatives for the proposed anesthesia with the patient or authorized representative who has indicated his/her understanding and acceptance.   Dental advisory given  Plan Discussed with: CRNA and Anesthesiologist  Anesthesia Plan Comments:         Anesthesia Quick Evaluation

## 2018-07-30 NOTE — Anesthesia Procedure Notes (Signed)
Procedure Name: Intubation Date/Time: 07/30/2018 3:30 PM Performed by: Allean Found, CRNA Pre-anesthesia Checklist: Patient identified, Patient being monitored, Timeout performed, Emergency Drugs available and Suction available Patient Re-evaluated:Patient Re-evaluated prior to induction Oxygen Delivery Method: Circle system utilized Preoxygenation: Pre-oxygenation with 100% oxygen Induction Type: IV induction Ventilation: Mask ventilation without difficulty Laryngoscope Size: Mac and 4 Grade View: Grade I Tube type: Oral Tube size: 7.5 mm Number of attempts: 1 Airway Equipment and Method: Stylet Placement Confirmation: ETT inserted through vocal cords under direct vision,  positive ETCO2 and breath sounds checked- equal and bilateral Secured at: 23 cm Tube secured with: Tape Dental Injury: Teeth and Oropharynx as per pre-operative assessment

## 2018-07-30 NOTE — Op Note (Signed)
Robotic assisted laparoscopic Cholecystectomy  Pre-operative Diagnosis: Biliary colic  Post-operative Diagnosis: same   Surgeon: Sterling Big, MD FACS  Anesthesia: Gen. with endotracheal tube   Findings: GB  Estimated Blood Loss: 10cc         Drains: none         Specimens: Gallbladder           Complications: none   Procedure Details  The patient was seen again in the Holding Room. The benefits, complications, treatment options, and expected outcomes were discussed with the patient. The risks of bleeding, infection, recurrence of symptoms, failure to resolve symptoms, bile duct damage, bile duct leak, retained common bile duct stone, bowel injury, any of which could require further surgery and/or ERCP, stent, or papillotomy were reviewed with the patient. The likelihood of improving the patient's symptoms with return to their baseline status is good.  The patient and/or family concurred with the proposed plan, giving informed consent.  The patient was taken to Operating Room, identified as Nnamdi Dacus Miltner and the procedure verified as robotic laparoscopic Cholecystectomy.  A Time Out was held and the above information confirmed.  Prior to the induction of general anesthesia, antibiotic prophylaxis was administered. VTE prophylaxis was in place. General endotracheal anesthesia was then administered and tolerated well. After the induction, the abdomen was prepped with Chloraprep and draped in the sterile fashion. The patient was positioned in the supine position.  Cut down technique was used to enter the abdominal cavity and a Hasson trochar was placed after two vicryl stitches were anchored to the fascia. Pneumoperitoneum was then created with CO2 and tolerated well without any adverse changes in the patient's vital signs.  Three 8-mm ports were placed under direct vision. All skin incisions  were infiltrated with a local anesthetic agent before making the incision and placing the  trocars.   The patient was positioned  in reverse Trendelenburg. Robot was brought to the surgical field and docked in the standard fashion.  We made sure there was no collision between arms and all the robotic instruments were direct visualization at all times.  I scrubbed out and went to the console.   The gallbladder was identified, the fundus grasped and retracted cephalad. Adhesions were lysed bluntly. The infundibulum was grasped and retracted laterally, exposing the peritoneum overlying the triangle of Calot. This was then divided and exposed in a blunt fashion. An extended critical view of the cystic duct and cystic artery was obtained.  The cystic duct was clearly identified and bluntly dissected.   Artery and duct were double clipped and divided.  The gallbladder was taken from the gallbladder fossa in a retrograde fashion with the electrocautery.  Hemostasis was achieved with the electrocautery.  I scrubbed back in and the robot was undocked in the standard fashion.The gallbladder was removed and placed in an Endocatch bag.  Inspection of the right upper quadrant was performed. No bleeding, bile duct injury or leak, or bowel injury was noted. Pneumoperitoneum was released.  The periumbilical port site was closed with interrumpted 0 Vicryl sutures. 4-0 subcuticular Monocryl was used to close the skin. Dermabond was  applied.  The patient was then extubated and brought to the recovery room in stable condition. Sponge, lap, and needle counts were correct at closure and at the conclusion of the case.               Sterling Big, MD, FACS

## 2018-07-31 ENCOUNTER — Encounter: Payer: Self-pay | Admitting: Surgery

## 2018-08-03 LAB — SURGICAL PATHOLOGY

## 2018-08-05 ENCOUNTER — Encounter: Payer: Self-pay | Admitting: Surgery

## 2018-08-05 ENCOUNTER — Ambulatory Visit (INDEPENDENT_AMBULATORY_CARE_PROVIDER_SITE_OTHER): Payer: BLUE CROSS/BLUE SHIELD | Admitting: Surgery

## 2018-08-05 ENCOUNTER — Other Ambulatory Visit: Payer: Self-pay

## 2018-08-05 VITALS — BP 120/76 | HR 62 | Temp 98.7°F | Ht 70.0 in | Wt 214.4 lb

## 2018-08-05 DIAGNOSIS — Z09 Encounter for follow-up examination after completed treatment for conditions other than malignant neoplasm: Secondary | ICD-10-CM

## 2018-08-05 NOTE — Progress Notes (Signed)
S/p rob chole Doing very well No pain Taking po Path d/w pt  PE NAD Abd: soft, nt , incisions c/d/i  A/p Doing well No heavy lifting RTC prn

## 2018-08-05 NOTE — Patient Instructions (Signed)
Return as needed.The patient is aware to call back for any questions or concerns.  

## 2018-08-06 ENCOUNTER — Telehealth: Payer: Self-pay

## 2018-08-06 NOTE — Telephone Encounter (Signed)
Patient notified that FMLA paperwork is ready for him to pick up at front desk.

## 2018-08-08 ENCOUNTER — Ambulatory Visit
Admission: EM | Admit: 2018-08-08 | Discharge: 2018-08-08 | Disposition: A | Discharge status: Home / Self Care | Payer: BLUE CROSS/BLUE SHIELD | Attending: Family Medicine | Admitting: Family Medicine

## 2018-08-08 ENCOUNTER — Other Ambulatory Visit: Payer: Self-pay

## 2018-08-08 DIAGNOSIS — R21 Rash and other nonspecific skin eruption: Secondary | ICD-10-CM | POA: Diagnosis not present

## 2018-08-08 DIAGNOSIS — L231 Allergic contact dermatitis due to adhesives: Secondary | ICD-10-CM

## 2018-08-08 MED ORDER — PREDNISONE 10 MG PO TABS: 10.0000 mg | ORAL_TABLET | Freq: Every day | ORAL | 0 refills | Status: DC

## 2018-08-08 MED ORDER — DEXAMETHASONE SODIUM PHOSPHATE 10 MG/ML IJ SOLN
10.0000 mg | Freq: Once | INTRAMUSCULAR | Status: AC
  Administered 2018-08-08: 10 mg via INTRAMUSCULAR

## 2018-08-08 NOTE — ED Provider Notes (Addendum)
[facility]    CSN: [id number] Arrival date & time: 08/08/18  1233     History   Chief Complaint Chief Complaint  Patient presents with  . Rash    HPI Randy Farrell is a 22 y.o. male.   Presents to the urgent care facility for evaluation of skin rash.  He is status post robotic assisted cholecystectomy 10 days ago.  Everything is been going well in regards to his surgery.  7 days ago he started developing a rash along his abdomen and what appears to be a distribution where he was draped, a horseshoe distribution.  Patient states rashes been increasing.  Is been applying cortisone cream without much relief.  Is only taken Benadryl once.  The rashes pruritic.  He denies any incision site drainage, abdominal pain, fevers, nausea or vomiting.  Rash started to spread to his thighs and forearms.  He denies any difficulty swallowing, shortness of breath or wheezing.  Patient states he was placed on amoxicillin 11 days ago for a sinus infection and just finished this medication last night.  Has tolerated amoxicillin well in the past.  HPI  Past Medical History:  Diagnosis Date  . GERD (gastroesophageal reflux disease)   . Sore throat 07/27/2018   to call surgeons office and inform them  . Wears contact lenses     Patient Active Problem List   Diagnosis Date Noted  . Calculus of gallbladder without cholecystitis without obstruction   . Problems with swallowing and mastication   . Gastroesophageal reflux disease   . Gastritis without bleeding   . 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 ESOPHAGOGASTRODUODENOSCOPY (EGD) WITH PROPOFOL N/A 11/07/2017   Procedure: ESOPHAGOGASTRODUODENOSCOPY (EGD) WITH PROPOFOL;  Surgeon: Midge Minium, MD;  Location: [facility];  Service: Endoscopy;  Laterality: N/A;  . FOOT FRACTURE SURGERY Right 05/2013  . HERNIA REPAIR     as infant  . ROBOTIC ASSISTED LAPAROSCOPIC  CHOLECYSTECTOMY-MULTI SITE N/A 07/30/2018   Procedure: ROBOTIC ASSISTED LAPAROSCOPIC CHOLECYSTECTOMY-MULTI SITE;  Surgeon: Leafy Ro, MD;  Location: [facility];  Service: General;  Laterality: N/A;       Home Medications    Prior to Admission medications   Medication Sig Start Date End Date Taking? Authorizing Provider  pantoprazole (PROTONIX) 40 MG tablet TAKE 1 TABLET(40 MG) BY MOUTH DAILY Patient taking differently: Take 40 mg by mouth every morning.  03/30/18   Duanne Limerick, MD  predniSONE (DELTASONE) 10 MG tablet Take 1 tablet (10 mg total) by mouth daily. 6,5,4,3,2,1 six day taper 08/08/18   Evon Slack, PA-C    Family History Family History  Problem Relation Age of Onset  . Healthy Mother   . Healthy Father   . Brain cancer Maternal Grandmother   . Throat cancer Paternal Grandmother   . Lung cancer Paternal Grandmother     Social History Social History   Tobacco Use  . Smoking status: Passive Smoke Exposure - Never Smoker  . Smokeless tobacco: Never Used  Substance Use Topics  . Alcohol use: Yes    Alcohol/week: 0.0 standard drinks    Comment: social  . Drug use: No     Allergies   Patient has no known allergies.   Review of Systems Review of Systems  Constitutional: Negative for fever.  HENT: Negative for trouble swallowing.   Respiratory: Negative for shortness of breath.   Cardiovascular: Negative for chest pain.  Gastrointestinal: Negative for  abdominal pain, nausea and vomiting.  Genitourinary: Negative for difficulty urinating, dysuria and urgency.  Musculoskeletal: Negative for back pain and myalgias.  Skin: Positive for rash.  Neurological: Negative for dizziness and headaches.     Physical Exam Triage Vital Signs ED Triage Vitals  Enc Vitals Group     BP 08/08/18 1241 114/76     Pulse Rate 08/08/18 1241 70     Resp 08/08/18 1241 16     Temp 08/08/18 1241 98.1 F (36.7 C)     Temp Source 08/08/18 1241 Oral     SpO2 08/08/18  1241 99 %     Weight 08/08/18 1243 218 lb (98.9 kg)     Height 08/08/18 1243 5\' 11"  (1.803 m)     Head Circumference --      Peak Flow --      Pain Score 08/08/18 1243 0     Pain Loc --      Pain Edu? --      Excl. in GC? --    No data found.  Updated Vital Signs BP 114/76 (BP Location: Left Arm)   Pulse 70   Temp 98.1 F (36.7 C) (Oral)   Resp 16   Ht 5\' 11"  (1.803 m)   Wt 218 lb (98.9 kg)   SpO2 99%   BMI 30.40 kg/m   Visual Acuity Right Eye Distance:   Left Eye Distance:   Bilateral Distance:    Right Eye Near:   Left Eye Near:    Bilateral Near:     Physical Exam  Constitutional: He is oriented to person, place, and time. He appears well-developed and well-nourished.  HENT:  Head: Normocephalic and atraumatic.  Right Ear: External ear normal.  Left Ear: External ear normal.  Mouth/Throat: Oropharynx is clear and moist. No oropharyngeal exudate.  Eyes: Pupils are equal, round, and reactive to light. Conjunctivae and EOM are normal.  Neck: Normal range of motion. Neck supple.  Cardiovascular: Normal rate, regular rhythm, normal heart sounds and intact distal pulses.  Pulmonary/Chest: Effort normal and breath sounds normal. No respiratory distress. He has no wheezes. He has no rales. He exhibits no tenderness.  Abdominal: Soft. Bowel sounds are normal. He exhibits no distension. There is no tenderness. There is no guarding.  Abdominal incision sites are intact with no warmth redness or drainage.  He is nontender along the incision sites.  Incision sites appear to be healed.  Is nontender to palpation throughout the abdomen.  Musculoskeletal: Normal range of motion. He exhibits no edema or tenderness.  Neurological: He is alert and oriented to person, place, and time.  Skin: Skin is warm and dry.  Red maculopapular rash most severe in a horseshoe distribution of the abdomen where his abdomen was not shaved.  This appears to be where the adhesive drape was placed.   Mild maculopapular rash along the forearms along the volar aspects and anterior thighs bilaterally.  Psychiatric: He has a normal mood and affect. His behavior is normal. Judgment and thought content normal.     UC Treatments / Results  Labs (all labs ordered are listed, but only abnormal results are displayed) Labs Reviewed - No data to display  EKG None  Radiology No results found.  Procedures Procedures (including critical care time)  Medications Ordered in UC Medications  dexamethasone (DECADRON) injection 10 mg (10 mg Intramuscular Given 08/08/18 1302)    Initial Impression / Assessment and Plan / UC Course  I have reviewed the triage  vital signs and the nursing notes.  Pertinent labs & imaging results that were available during my care of the patient were reviewed by me and considered in my medical decision making (see chart for details).     22 year old male with pruritic rash increasing over the last week.  Rash most severe along the distribution where adhesive was applied to his abdomen during recent cholecystectomy.  Is also got more systemic rash on the left and right proximal thigh and left and right volar aspect of both forearms.  Patient with possible contact dermatitis from adhesive as well as allergic reaction from amoxicillin.  Patient is given 10 mg of dexamethasone IM along with a 6-day steroid taper.  He will continue with Benadryl for itching as needed.  No sign of infection today, his abdomen is soft incision sites are healed and appear very well with no tenderness to palpation.  Patient feels well.  Patient will follow-up with primary care provider or surgeon in 2 days if no improvement. Final Clinical Impressions(s) / UC Diagnoses   Final diagnoses:  Rash  Allergic contact dermatitis due to adhesives     Discharge Instructions     Please take prednisone as prescribed.  You may start 60 mg of prednisone today.  Use Benadryl as needed for itching, 50 mg  every 6 hours as needed.  Benadryl will make you sleepy, do not drive while taking Benadryl.  If any fevers, pain, nausea or vomiting please return to the clinic or emergency department.    ED Prescriptions    Medication Sig Dispense Auth. Provider   predniSONE (DELTASONE) 10 MG tablet Take 1 tablet (10 mg total) by mouth daily. 6,5,4,3,2,1 six day taper 21 tablet Ronnette Juniper       Evon Slack, [place] 08/08/18 1325    Evon Slack, [place] 08/08/18 1325

## 2018-08-08 NOTE — Discharge Instructions (Addendum)
Please take prednisone as prescribed.  You may start 60 mg of prednisone today.  Use Benadryl as needed for itching, 50 mg every 6 hours as needed.  Benadryl will make you sleepy, do not drive while taking Benadryl.  If any fevers, pain, nausea or vomiting please return to the clinic or emergency department.

## 2018-08-08 NOTE — ED Triage Notes (Signed)
Pt had gall bladder surgery on Thursday. Rash developed over abdomen on Saturday and now spreading to left arm. Finished Amoxicillin yesterday. Using Hydrocortisone  And Fluocinonide.

## 2018-08-10 NOTE — Anesthesia Postprocedure Evaluation (Signed)
Anesthesia Post Note  Patient: Randy Farrell  Procedure(s) Performed: ROBOTIC ASSISTED LAPAROSCOPIC CHOLECYSTECTOMY-MULTI SITE (N/A Abdomen)  Patient location during evaluation: PACU Anesthesia Type: General Level of consciousness: awake and alert and oriented Pain management: pain level controlled Vital Signs Assessment: post-procedure vital signs reviewed and stable Respiratory status: spontaneous breathing Cardiovascular status: blood pressure returned to baseline Anesthetic complications: no     Last Vitals:  Vitals:   07/30/18 1739 07/30/18 1823  BP: (!) 123/91 118/61  Pulse: 81 71  Resp: 20 20  Temp: (!) 36.1 C (!) 36.1 C  SpO2: 100% 94%    Last Pain:  Vitals:   07/30/18 1823  TempSrc:   PainSc: 5                  Jager Koska

## 2019-04-13 ENCOUNTER — Telehealth: Payer: Self-pay

## 2019-04-13 ENCOUNTER — Other Ambulatory Visit: Payer: BLUE CROSS/BLUE SHIELD

## 2019-04-13 DIAGNOSIS — R6889 Other general symptoms and signs: Secondary | ICD-10-CM | POA: Diagnosis not present

## 2019-04-13 DIAGNOSIS — Z20822 Contact with and (suspected) exposure to covid-19: Secondary | ICD-10-CM

## 2019-04-13 NOTE — Telephone Encounter (Signed)
Contacted pt to schedule COVID test; pt accepted appointment at [facility] site 04/13/2019 at 1415; pt given address, location, and instructions that he and all occupants of his vehicle should wear masks; he verbalized understanding; orders placed per protocol.

## 2019-04-13 NOTE — Telephone Encounter (Signed)
Received call from patient with concerns  that he has been in close contact with COVID-19 positive case in the workplace. Patient exhibiting signs of myalgias, no other symptoms noted. Patient is requesting to be tested.

## 2019-04-15 ENCOUNTER — Other Ambulatory Visit: Payer: Self-pay

## 2019-04-15 ENCOUNTER — Telehealth: Payer: Self-pay

## 2019-04-15 NOTE — Telephone Encounter (Signed)
Placed call to patient to follow up with any possible symptoms patient may be exhibiting. Left message to return call.

## 2019-04-21 LAB — NOVEL CORONAVIRUS, NAA: SARS-CoV-2, NAA: NOT DETECTED

## 2019-06-14 ENCOUNTER — Other Ambulatory Visit: Payer: Self-pay

## 2019-06-14 ENCOUNTER — Encounter: Payer: Self-pay | Admitting: Family Medicine

## 2019-06-14 ENCOUNTER — Ambulatory Visit: Payer: BC Managed Care – PPO | Admitting: Family Medicine

## 2019-06-14 VITALS — BP 120/60 | HR 80 | Ht 71.0 in | Wt 224.0 lb

## 2019-06-14 DIAGNOSIS — K219 Gastro-esophageal reflux disease without esophagitis: Secondary | ICD-10-CM

## 2019-06-14 DIAGNOSIS — Z23 Encounter for immunization: Secondary | ICD-10-CM

## 2019-06-14 MED ORDER — PANTOPRAZOLE SODIUM 40 MG PO TBEC: DELAYED_RELEASE_TABLET | ORAL | 3 refills | Status: DC

## 2019-06-14 NOTE — Progress Notes (Signed)
Date:  06/14/2019   Name:  Randy Farrell   DOB:  07-18-1996   MRN:  [id number]   Chief Complaint: Gastroesophageal Reflux (refill pantoprazole)  Gastroesophageal Reflux He complains of heartburn. He reports no abdominal pain, no belching, no chest pain, no choking, no coughing, no dysphagia, no early satiety, no globus sensation, no hoarse voice, no nausea, no sore throat, no stridor, no tooth decay, no water brash or no wheezing. no meds for 2 weeks. This is a chronic problem. The current episode started more than 1 year ago. The problem occurs occasionally. The problem has been waxing and waning. The heartburn is of moderate intensity. The symptoms are aggravated by certain foods. Pertinent negatives include no anemia, fatigue, melena, muscle weakness, orthopnea or weight loss. Risk factors include obesity. He has tried a PPI for the symptoms. The treatment provided moderate relief.    Review of Systems  Constitutional: Negative for chills, fatigue, fever and weight loss.  HENT: Negative for drooling, ear discharge, ear pain, hoarse voice and sore throat.   Respiratory: Negative for cough, choking, shortness of breath and wheezing.   Cardiovascular: Negative for chest pain, palpitations and leg swelling.  Gastrointestinal: Positive for heartburn. Negative for abdominal pain, blood in stool, constipation, diarrhea, dysphagia, melena and nausea.  Endocrine: Negative for polydipsia.  Genitourinary: Negative for dysuria, frequency, hematuria and urgency.  Musculoskeletal: Negative for back pain, myalgias, muscle weakness and neck pain.  Skin: Negative for rash.  Allergic/Immunologic: Negative for environmental allergies.  Neurological: Negative for dizziness and headaches.  Hematological: Does not bruise/bleed easily.  Psychiatric/Behavioral: Negative for suicidal ideas. The patient is not nervous/anxious.     Patient Active Problem List   Diagnosis Date Noted  . Calculus of  gallbladder without cholecystitis without obstruction   . Problems with swallowing and mastication   . Gastroesophageal reflux disease   . Gastritis without bleeding   . 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 ESOPHAGOGASTRODUODENOSCOPY (EGD) WITH PROPOFOL N/A 11/07/2017   Procedure: ESOPHAGOGASTRODUODENOSCOPY (EGD) WITH PROPOFOL;  Surgeon: Midge MiniumWohl, Darren, MD;  [facility];  Service: Endoscopy;  Laterality: N/A;  . FOOT FRACTURE SURGERY Right 05/2013  . HERNIA REPAIR     as infant  . ROBOTIC ASSISTED LAPAROSCOPIC CHOLECYSTECTOMY-MULTI SITE N/A 07/30/2018   Procedure: ROBOTIC ASSISTED LAPAROSCOPIC CHOLECYSTECTOMY-MULTI SITE;  Surgeon: Leafy RoPabon, Diego F, MD;  Location: [facility];  Service: General;  Laterality: N/A;    Social History   Tobacco Use  . Smoking status: Passive Smoke Exposure - Never Smoker  . Smokeless tobacco: Never Used  Substance Use Topics  . Alcohol use: Yes    Alcohol/week: 0.0 standard drinks    Comment: social  . Drug use: No     Medication list has been reviewed and updated.  Current Meds  Medication Sig  . pantoprazole (PROTONIX) 40 MG tablet TAKE 1 TABLET(40 MG) BY MOUTH DAILY (Patient taking differently: Take 40 mg by mouth every morning. )    PHQ 2/9 Scores 06/14/2019 03/30/2018 12/26/2015  PHQ - 2 Score 0 0 0  PHQ- 9 Score 0 0 -    BP Readings from Last 3 Encounters:  06/14/19 120/60  08/08/18 114/76  08/05/18 120/76    Physical Exam Vitals signs and nursing note reviewed.  HENT:     Head: Normocephalic.     Right Ear: External ear normal.     Left Ear: External ear normal.  Nose: Nose normal.  Eyes:     General: No scleral icterus.       Right eye: No discharge.        Left eye: No discharge.     Conjunctiva/sclera: Conjunctivae normal.     Pupils: Pupils are equal, round, and reactive to light.  Neck:     Musculoskeletal: Normal range of motion  and neck supple.     Thyroid: No thyromegaly.     Vascular: No JVD.     Trachea: No tracheal deviation.  Cardiovascular:     Rate and Rhythm: Normal rate and regular rhythm.     Heart sounds: Normal heart sounds. No murmur. No friction rub. No gallop.   Pulmonary:     Effort: No respiratory distress.     Breath sounds: Normal breath sounds. No wheezing or rales.  Abdominal:     General: Bowel sounds are normal.     Palpations: Abdomen is soft. There is no mass.     Tenderness: There is no abdominal tenderness. There is no guarding or rebound.  Musculoskeletal: Normal range of motion.        General: No tenderness.  Lymphadenopathy:     Cervical: No cervical adenopathy.  Skin:    General: Skin is warm.     Findings: No rash.  Neurological:     Mental Status: He is alert and oriented to person, place, and time.     Cranial Nerves: No cranial nerve deficit.     Deep Tendon Reflexes: Reflexes are normal and symmetric.     Wt Readings from Last 3 Encounters:  06/14/19 224 lb (101.6 kg)  08/08/18 218 lb (98.9 kg)  08/05/18 214 lb 6.4 oz (97.3 kg)    BP 120/60   Pulse 80   Ht 5\' 11"  (1.803 m)   Wt 224 lb (101.6 kg)   BMI 31.24 kg/m   Assessment and Plan: 1. Gastroesophageal reflux disease, esophagitis presence not specified Chronic.  Controlled.  Continue pantoprazole 40 mg once a day. - pantoprazole (PROTONIX) 40 MG tablet; TAKE 1 TABLET(40 MG) BY MOUTH DAILY  Dispense: 90 tablet; Refill: 3  2. Need for diphtheria-tetanus-pertussis (Tdap) vaccine Patient is due for diphtheria pertussis tetanus.  Administered. - Tdap vaccine greater than or equal to 7yo IM

## 2019-06-14 NOTE — Patient Instructions (Signed)

## 2019-08-04 ENCOUNTER — Other Ambulatory Visit: Payer: Self-pay

## 2019-08-04 ENCOUNTER — Ambulatory Visit: Payer: Self-pay

## 2019-08-04 VITALS — BP 148/98 | HR 87 | Resp 16 | Ht 71.0 in | Wt 232.0 lb

## 2019-08-04 DIAGNOSIS — Z008 Encounter for other general examination: Secondary | ICD-10-CM

## 2019-08-04 LAB — POCT LIPID PANEL
HDL: 35
LDL: 80
Non-HDL: 103
POC Glucose: 118 mg/dl — AB (ref 70–99)
TC/HDL: 4
TC: 138
TRG: 117

## 2019-08-04 NOTE — Progress Notes (Signed)
     Patient ID: Randy Farrell, male    DOB: Feb 23, 1996, 23 y.o.   MRN: [id number]    Thank you!!  Apolonio Schneiders RN  [place] Nurse Specialist [place]: [phone]  Cell:  [phone] Website: [email or web address]

## 2019-10-30 IMAGING — US US ABDOMEN LIMITED
1 series · 14 of 25 positions shown · non-contrast
Comparison: None.

CLINICAL DATA: Epigastric abdominal pain.

EXAM:
ULTRASOUND ABDOMEN LIMITED RIGHT UPPER QUADRANT

[Series 1: us abdomen limited · 0.23mm/px · 14 of 55 slices shown]
[im 1/55]
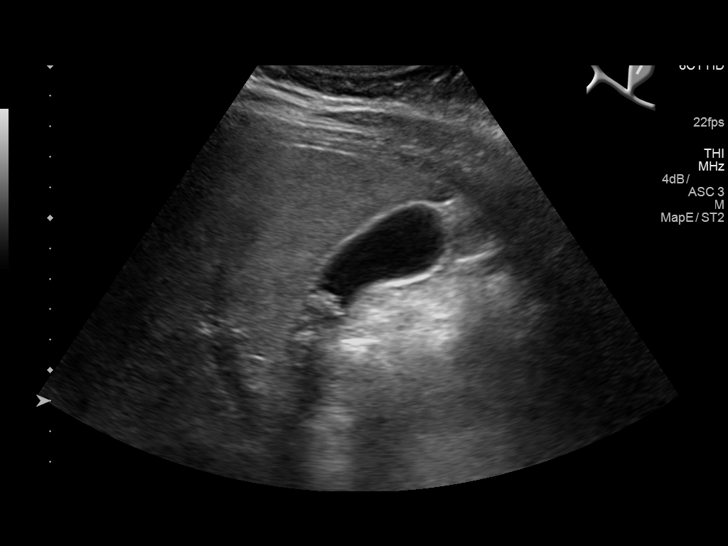
[im 5/55]
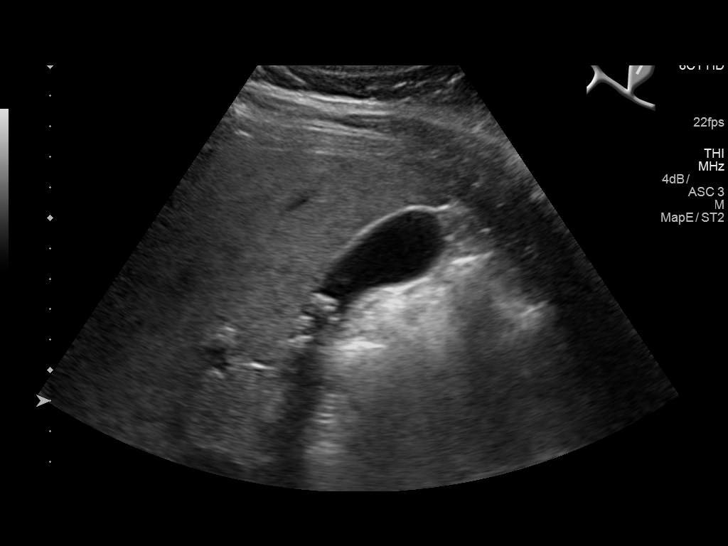
[im 10/55]
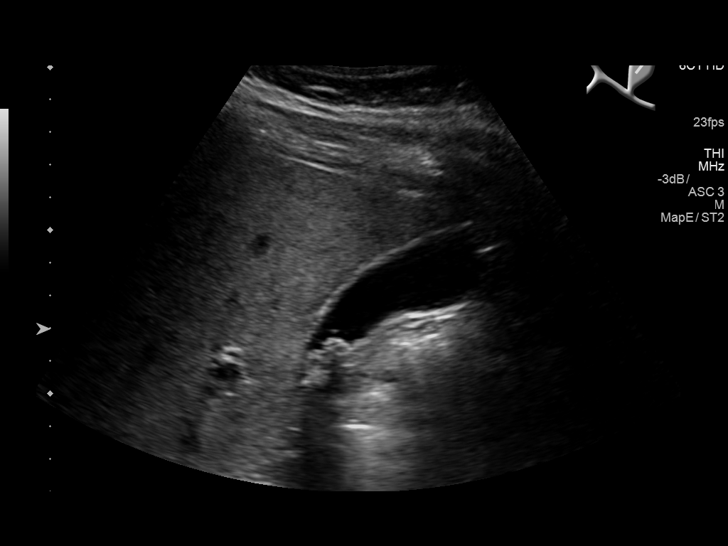
[im 14/55]
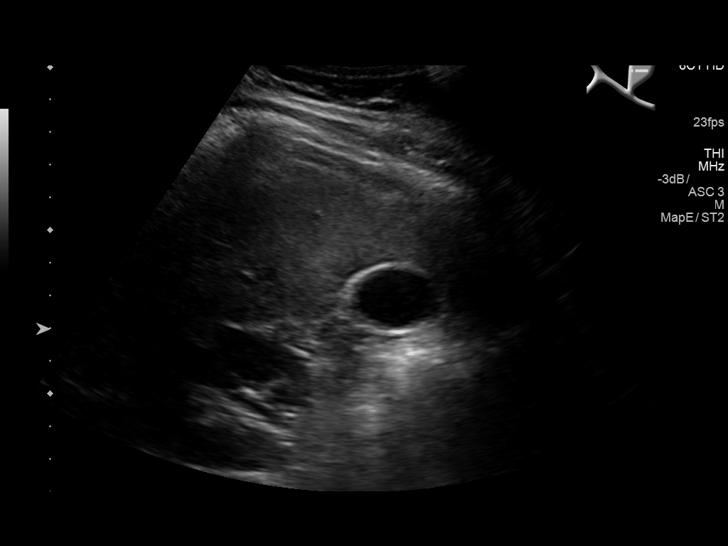
[im 19/55]
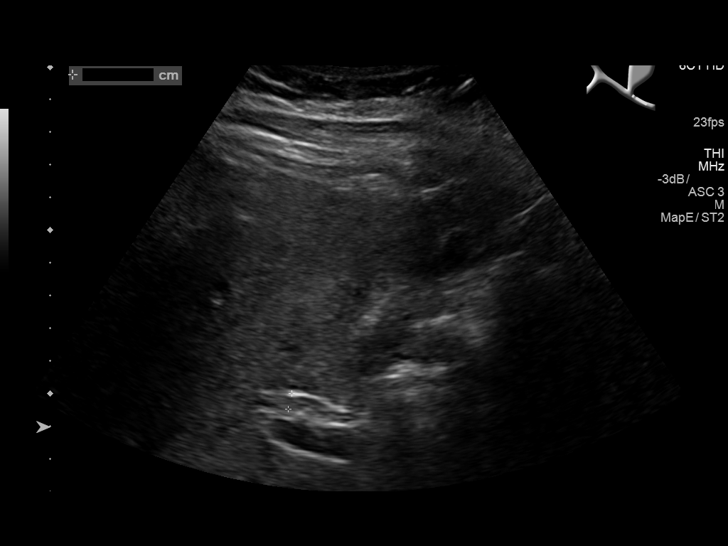
[im 21/55]
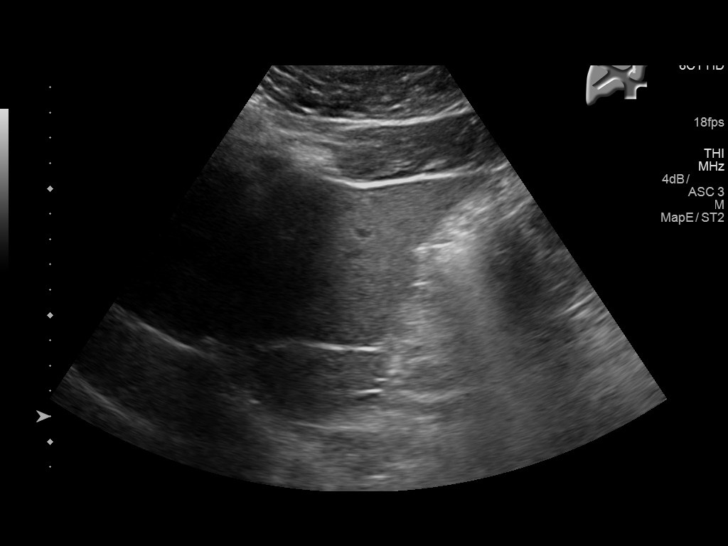
[im 25/55]
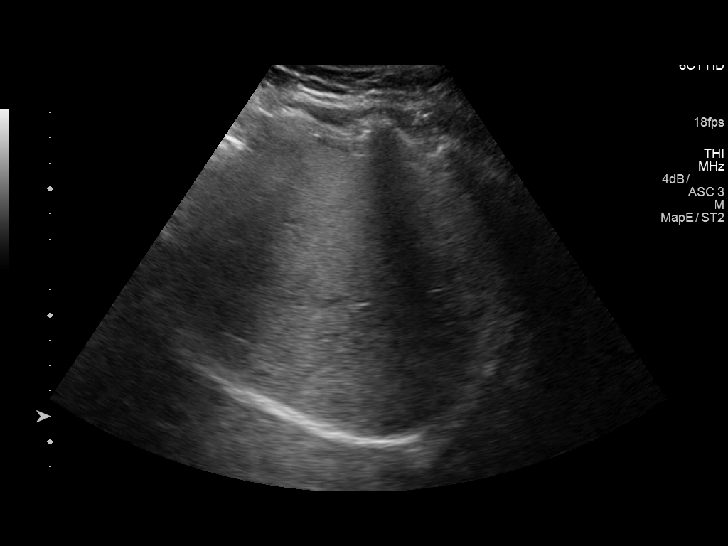
[im 30/55]
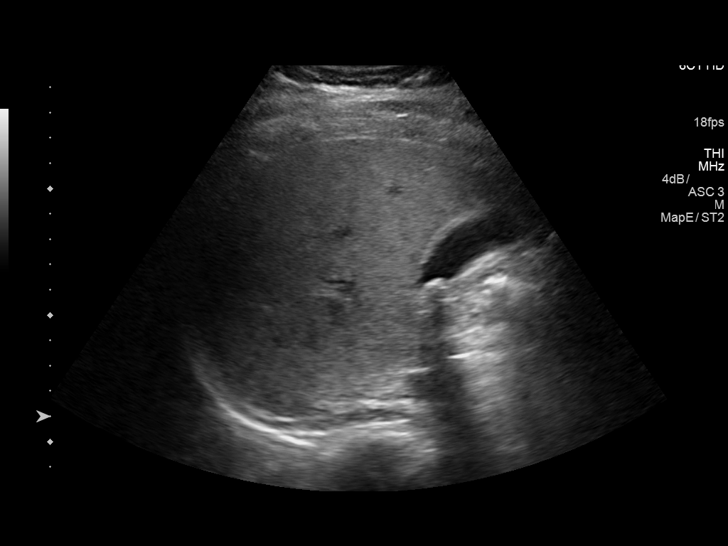
[im 34/55]
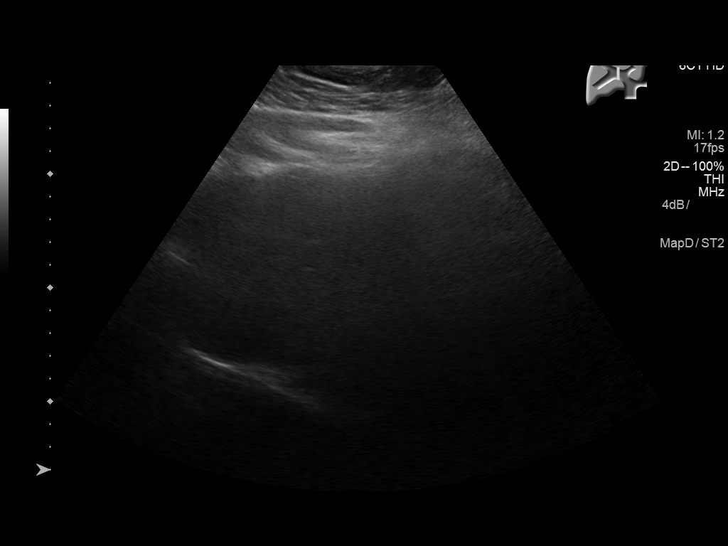
[im 37/55]
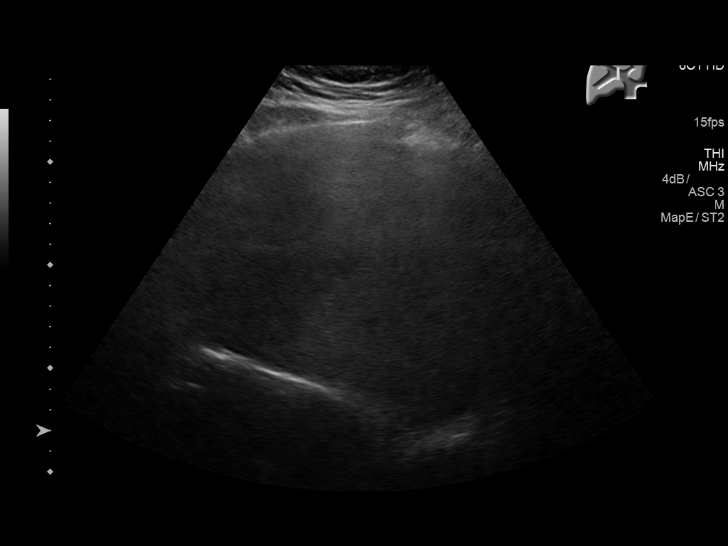
[im 41/55]
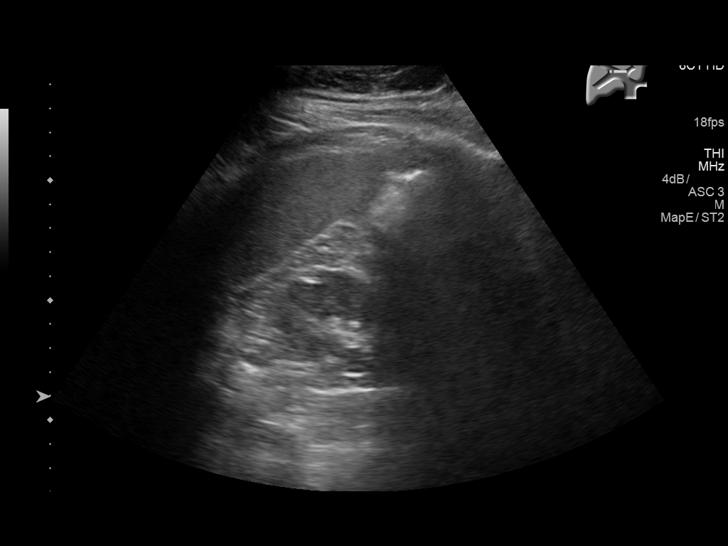
[im 46/55]
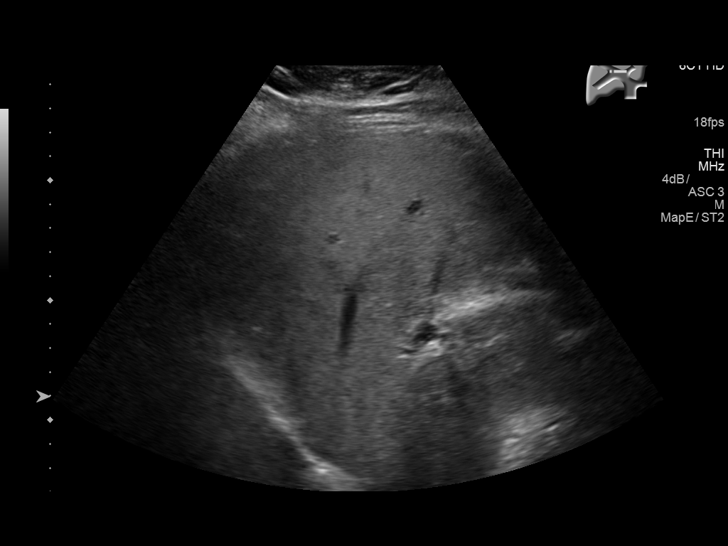
[im 50/55]
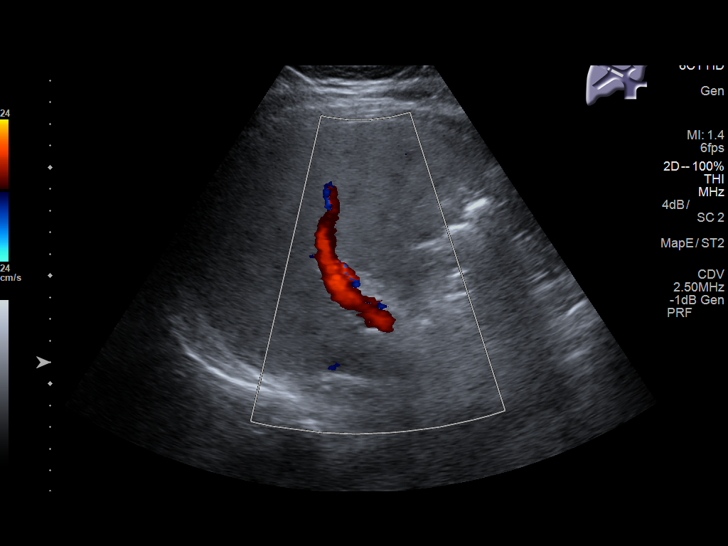
[im 55/55]
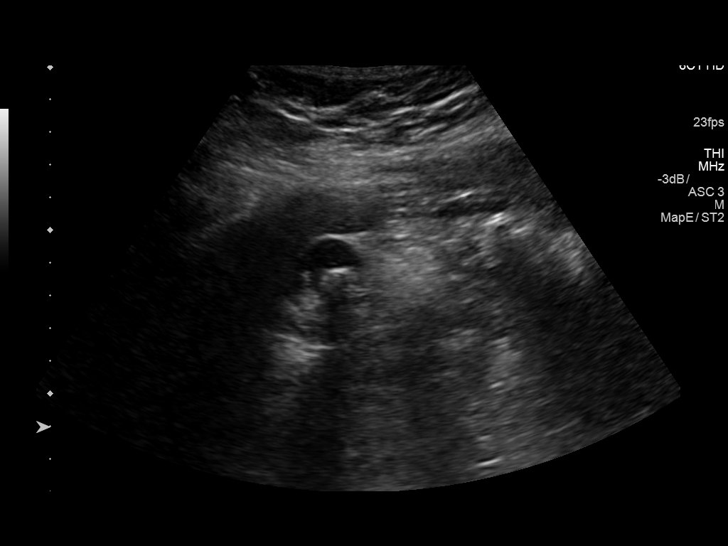

[14 of 25 positions shown; findings below may reference images not displayed]

FINDINGS: Gallbladder:

No gallbladder wall thickening visualized. No sonographic Murphy
sign noted by sonographer. Multiple layering gallstones, the largest
measuring 9 mm.

Common bile duct:

Diameter: 4.9 mm

Liver:

No focal lesion identified. Within normal limits in parenchymal
echogenicity. Portal vein is patent on color Doppler imaging with
normal direction of blood flow towards the liver.
IMPRESSION: Cholelithiasis.

No evidence of acute cholecystitis.

Prominence of the extrahepatic common bile duct. Choledocholithiasis
cannot be excluded with this appearance. No filling defects are seen
within the visualized portion of the common bile duct.

## 2019-11-25 IMAGING — US US ABDOMEN LIMITED
1 series · 14 of 25 positions shown · non-contrast
Comparison: June 25, 2018

CLINICAL DATA: Right upper quadrant pain

EXAM:
ULTRASOUND ABDOMEN LIMITED RIGHT UPPER QUADRANT

[Series 1: us abdomen limited · 0.23mm/px · 14 of 55 slices shown]
[im 1/55]
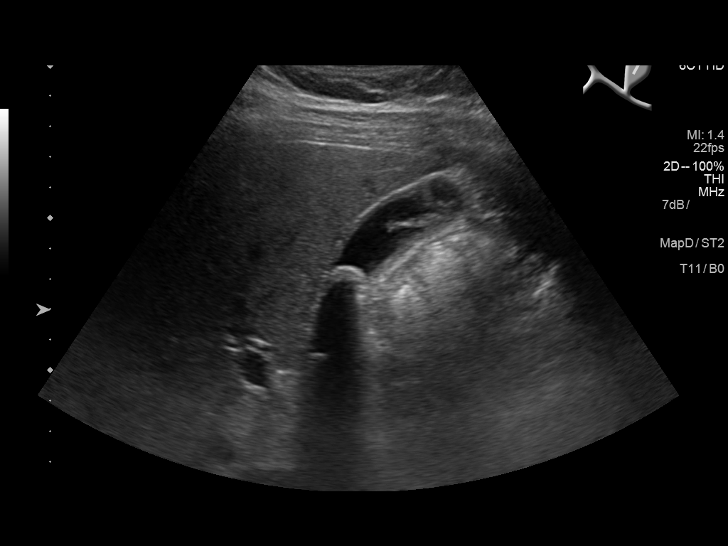
[im 5/55]
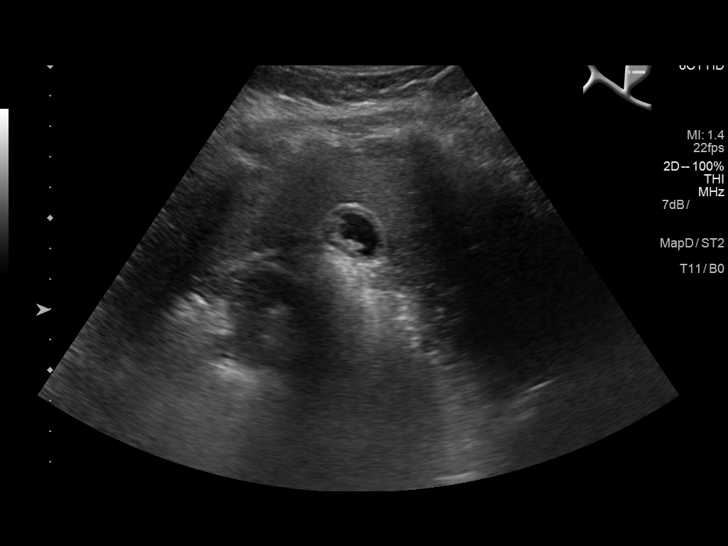
[im 10/55]
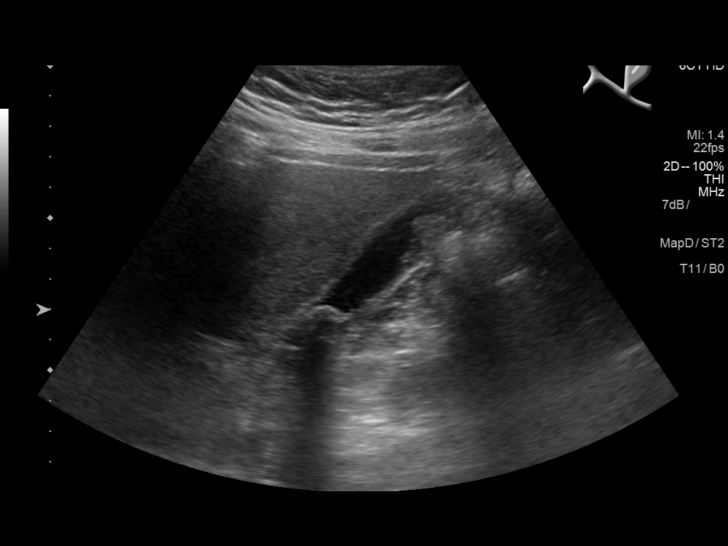
[im 14/55]
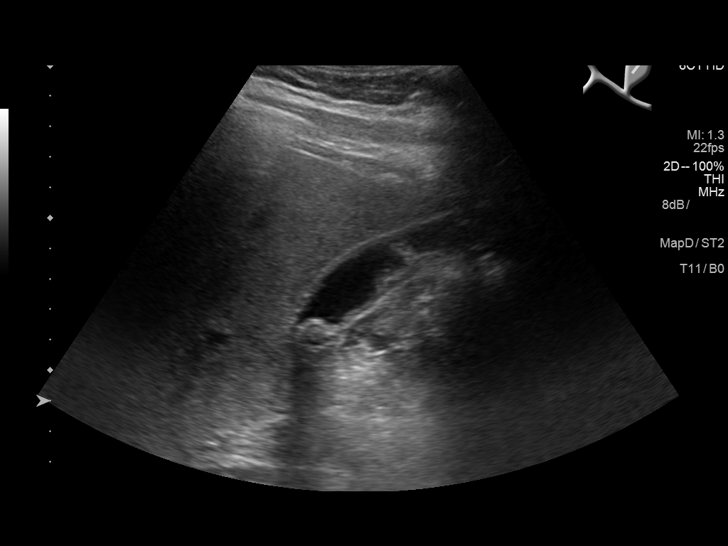
[im 19/55]
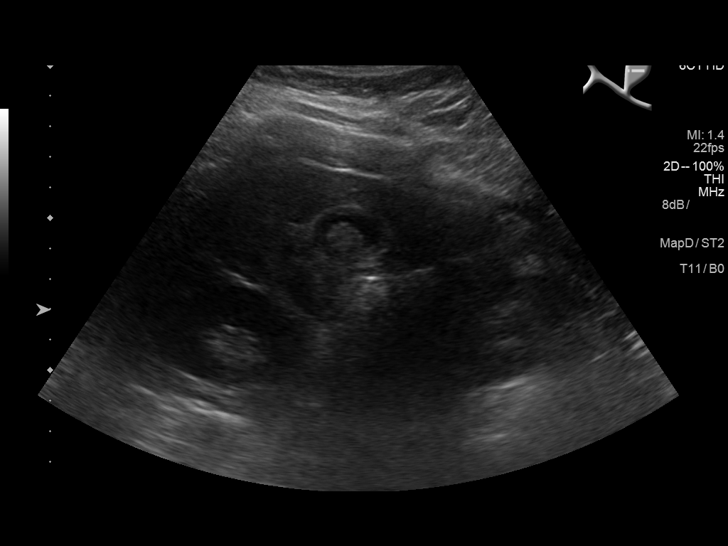
[im 21/55]
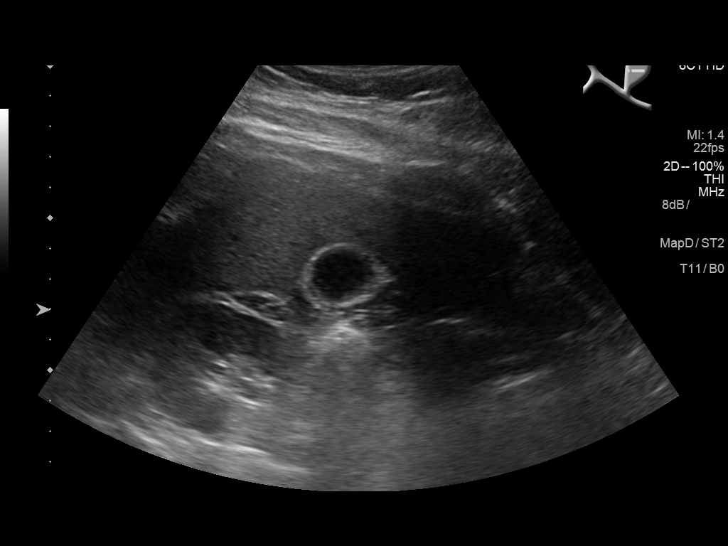
[im 25/55]
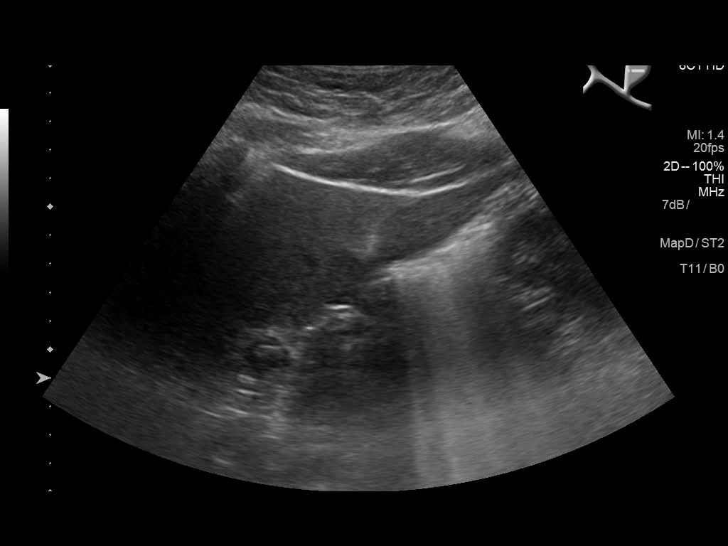
[im 30/55]
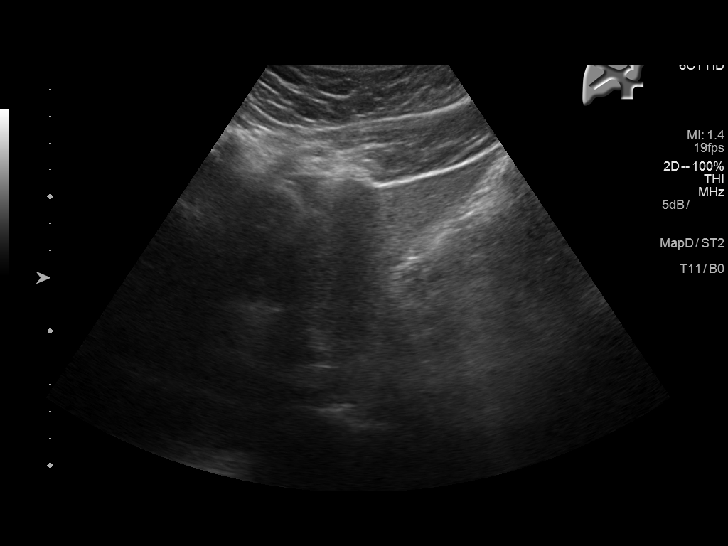
[im 34/55]
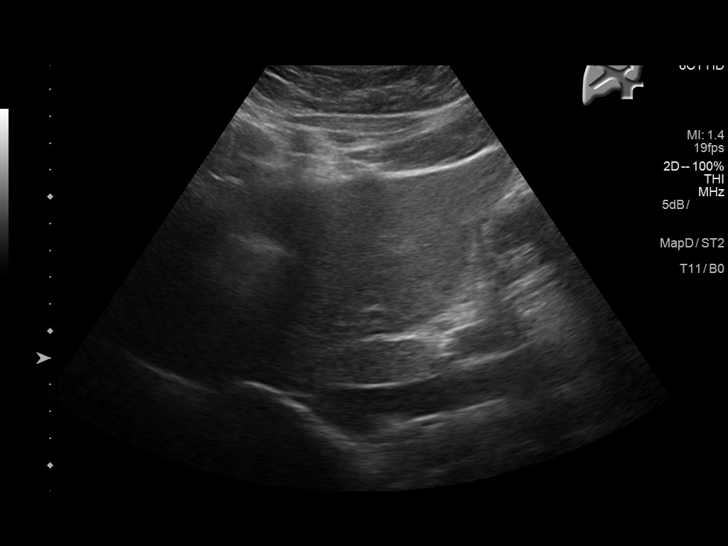
[im 37/55]
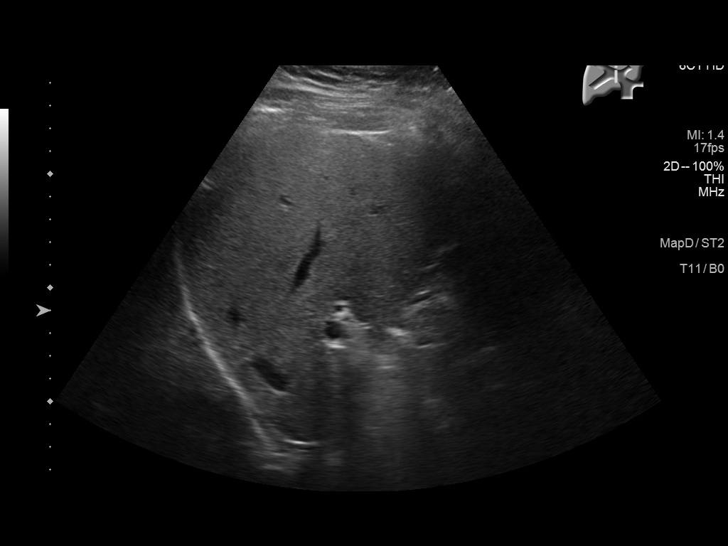
[im 41/55]
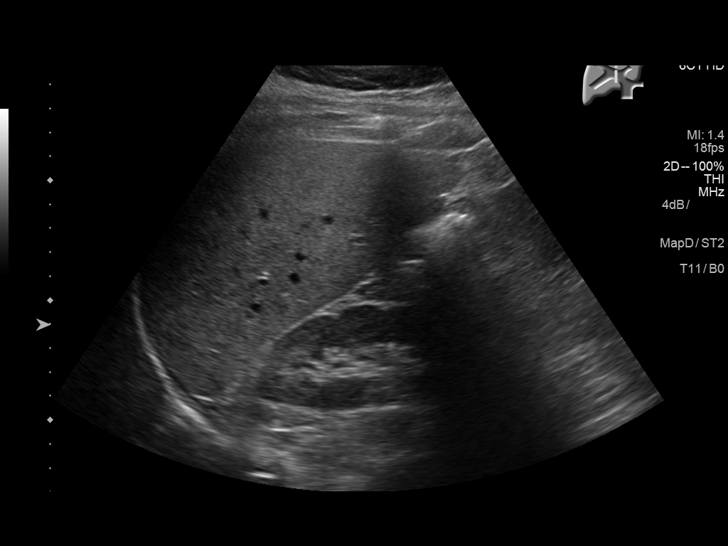
[im 46/55]
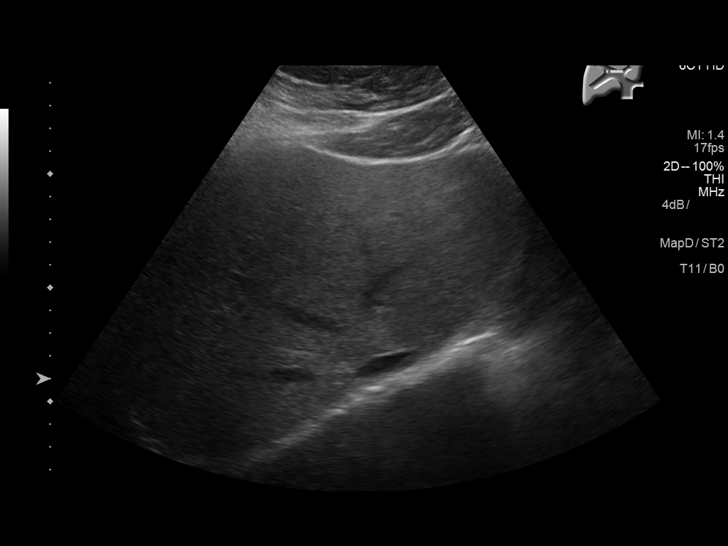
[im 50/55]
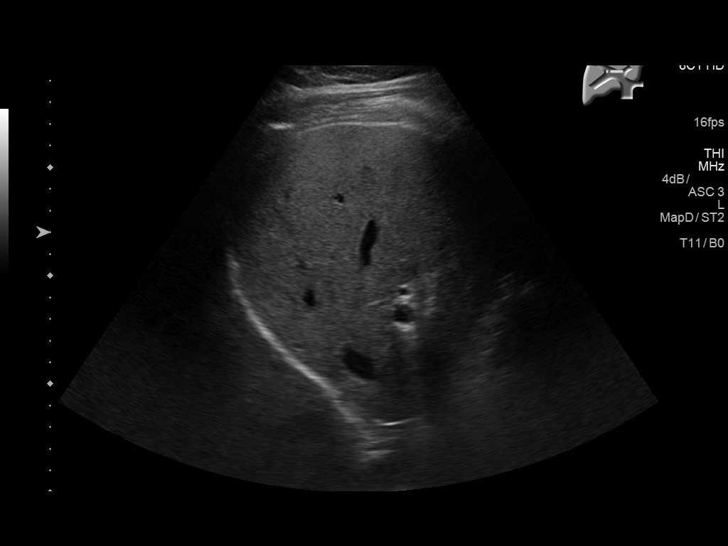
[im 55/55]
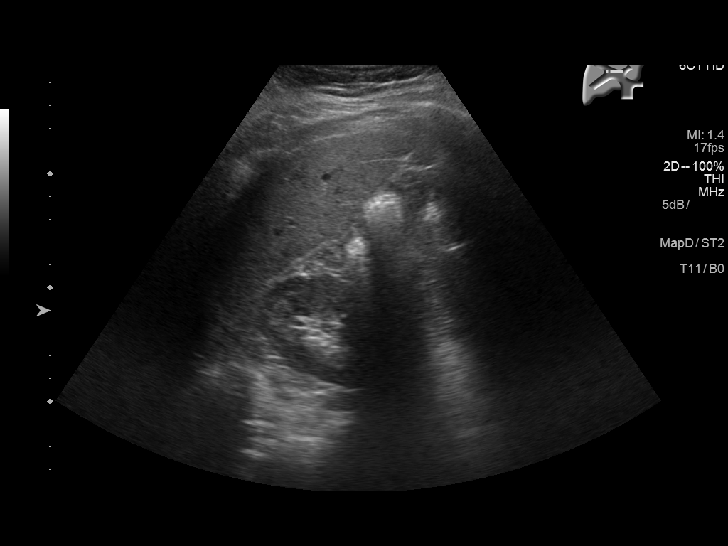

[14 of 25 positions shown; findings below may reference images not displayed]

FINDINGS: Gallbladder:

Within the gallbladder, there are multiple echogenic foci which move
and shadow consistent with cholelithiasis. Largest gallstone
measures 1.2 cm in length. There is also sludge within the
gallbladder. The gallbladder wall is borderline thickened. There is
no appreciable pericholecystic fluid. No sonographic Murphy sign
noted by sonographer.

Common bile duct:

Diameter: 5 mm. No intrahepatic or extrahepatic biliary duct
dilatation.

Liver:

No focal lesion identified. Within normal limits in parenchymal
echogenicity. Portal vein is patent on color Doppler imaging with
normal direction of blood flow towards the liver.
IMPRESSION: Cholelithiasis and sludge in gallbladder. Gallbladder wall is
borderline thickened. No pericholecystic fluid. Early acute
cholecystitis could present in this manner. In this regard, it may
be prudent to consider nuclear medicine hepatobiliary imaging study
to assess for cystic duct patency.

Study otherwise unremarkable.

## 2020-01-06 DIAGNOSIS — Z23 Encounter for immunization: Secondary | ICD-10-CM | POA: Diagnosis not present

## 2020-01-27 DIAGNOSIS — Z23 Encounter for immunization: Secondary | ICD-10-CM | POA: Diagnosis not present

## 2020-10-12 ENCOUNTER — Ambulatory Visit: Payer: Self-pay | Admitting: *Deleted

## 2020-10-12 NOTE — Telephone Encounter (Signed)
C/o possible exposure to HIV or STDs. Patient reports he had unprotected sex approx. 1 hour ago. Encouraged patient to wash anal area with soap and water. Instructed patient to go to [facility] or ED. Patient denies any broken skin or trauma. Care advise given. Patient verbalized understanding of care advise and to go to [facility] or ED today.   Reason for Disposition . [1] EXPOSURE of mouth, or other mucous membrane AND [2] with any body fluid  Answer Assessment - Initial Assessment Questions 1. DESCRIPTION: "Please describe what happened?" (e.g., spitting, splash, touching)     Unprotected anal sex  2. TYPE AND AMOUNT OF FLUID:  "What type of body fluid was it and how much? (e.g., saliva, blood, vomit; a few drops, a splash)      semen 3. ROUTE OF EXPOSURE: "What part of your body was exposed?" (e.g., eye, mouth, intact skin)      Anus 4. DATE-TIME of EXPOSURE: "When did this exposure occur?"     10/12/20 3:50pm 5. SOURCE PERSON HIV POSITIVE: "Is the other person HIV positive?" (e.g., yes, no, unknown)     unknown 6. SOURCE PERSON HEPATITIS POSITIVE: "Is the other person Hepatitis positive?" (e.g., yes, no, unknown)     unknown  Protocols used: BLOOD AND BODY FLUID EXPOSURE-A-AH

## 2020-11-27 ENCOUNTER — Ambulatory Visit: Payer: BC Managed Care – PPO | Admitting: Family Medicine

## 2020-11-27 ENCOUNTER — Encounter: Payer: Self-pay | Admitting: Family Medicine

## 2020-11-27 ENCOUNTER — Other Ambulatory Visit: Payer: Self-pay

## 2020-11-27 VITALS — BP 120/80 | HR 80 | Ht 70.0 in | Wt 222.0 lb

## 2020-11-27 DIAGNOSIS — Z8719 Personal history of other diseases of the digestive system: Secondary | ICD-10-CM | POA: Diagnosis not present

## 2020-11-27 DIAGNOSIS — K2100 Gastro-esophageal reflux disease with esophagitis, without bleeding: Secondary | ICD-10-CM | POA: Diagnosis not present

## 2020-11-27 MED ORDER — PANTOPRAZOLE SODIUM 40 MG PO TBEC: 40.0000 mg | DELAYED_RELEASE_TABLET | Freq: Every day | ORAL | 1 refills | Status: DC

## 2020-11-27 NOTE — Progress Notes (Addendum)
Date:  11/27/2020   Name:  Randy Farrell   DOB:  10-22-1995   MRN:  [id number]   Chief Complaint: Gastroesophageal Reflux  Gastroesophageal Reflux He reports no abdominal pain, no belching, no chest pain, no choking, no coughing, no dysphagia, no early satiety, no globus sensation, no heartburn, no hoarse voice, no nausea, no sore throat, no stridor, no tooth decay, no water brash or no wheezing. This is a new problem. The current episode started more than 1 year ago. The problem occurs constantly. The problem has been gradually improving. The symptoms are aggravated by certain foods. Pertinent negatives include no anemia, fatigue, melena, muscle weakness, orthopnea or weight loss. There are no known risk factors. He has tried a PPI for the symptoms. The treatment provided moderate relief. Past procedures do not include an abdominal ultrasound, an EGD, esophageal manometry, esophageal pH monitoring, H. pylori antibody titer or a UGI.    Lab Results  Component Value Date   CREATININE 0.97 07/20/2018   BUN 12 07/20/2018   NA 141 07/20/2018   K 3.8 07/20/2018   CL 104 07/20/2018   CO2 28 07/20/2018   No results found for: CHOL, HDL, LDLCALC, LDLDIRECT, TRIG, CHOLHDL No results found for: TSH No results found for: HGBA1C Lab Results  Component Value Date   WBC 7.6 07/20/2018   HGB 15.6 07/20/2018   HCT 45.6 07/20/2018   MCV 85.4 07/20/2018   PLT 162 07/20/2018   Lab Results  Component Value Date   ALT 36 07/20/2018   AST 24 07/20/2018   ALKPHOS 85 07/20/2018   BILITOT 0.8 07/20/2018     Review of Systems  Constitutional: Negative for chills, fatigue, fever and weight loss.  HENT: Negative for drooling, ear discharge, ear pain, hoarse voice and sore throat.   Respiratory: Negative for cough, choking, shortness of breath and wheezing.   Cardiovascular: Negative for chest pain, palpitations and leg swelling.  Gastrointestinal: Negative for abdominal pain, blood in stool,  constipation, diarrhea, dysphagia, heartburn, melena and nausea.  Endocrine: Negative for polydipsia.  Genitourinary: Negative for dysuria, frequency, hematuria and urgency.  Musculoskeletal: Negative for back pain, myalgias, muscle weakness and neck pain.  Skin: Negative for rash.  Allergic/Immunologic: Negative for environmental allergies.  Neurological: Negative for dizziness and headaches.  Hematological: Does not bruise/bleed easily.  Psychiatric/Behavioral: Negative for suicidal ideas. The patient is not nervous/anxious.     Patient Active Problem List   Diagnosis Date Noted  . Calculus of gallbladder without cholecystitis without obstruction   . Problems with swallowing and mastication   . Gastroesophageal reflux disease   . Gastritis without bleeding   . 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 ESOPHAGOGASTRODUODENOSCOPY (EGD) WITH PROPOFOL N/A 11/07/2017   Procedure: ESOPHAGOGASTRODUODENOSCOPY (EGD) WITH PROPOFOL;  Surgeon: Midge Minium, MD;  Location: [facility];  Service: Endoscopy;  Laterality: N/A;  . FOOT FRACTURE SURGERY Right 05/2013  . HERNIA REPAIR     as infant  . ROBOTIC ASSISTED LAPAROSCOPIC CHOLECYSTECTOMY-MULTI SITE N/A 07/30/2018   Procedure: ROBOTIC ASSISTED LAPAROSCOPIC CHOLECYSTECTOMY-MULTI SITE;  Surgeon: Leafy Ro, MD;  Location: [facility];  Service: General;  Laterality: N/A;    Social History   Tobacco Use  . Smoking status: Passive Smoke Exposure - Never Smoker  . Smokeless tobacco: Never Used  Vaping Use  . Vaping Use: Never used  Substance Use Topics  . Alcohol use: Yes  Alcohol/week: 0.0 standard drinks    Comment: social  . Drug use: No     Medication list has been reviewed and updated.  No outpatient medications have been marked as taking for the 11/27/20 encounter (Office Visit) with Duanne Limerick, MD.    [facility] 2/9 Scores 11/27/2020 06/14/2019  03/30/2018 12/26/2015  PHQ - 2 Score 0 0 0 0  PHQ- 9 Score - 0 0 -    No flowsheet data found.  BP Readings from Last 3 Encounters:  11/27/20 120/80  08/04/19 (!) 148/98  06/14/19 120/60    Physical Exam Vitals and nursing note reviewed.  HENT:     Right Ear: Tympanic membrane, ear canal and external ear normal.     Left Ear: Tympanic membrane, ear canal and external ear normal.     Nose: Nose normal. No congestion or rhinorrhea.     Mouth/Throat:     Mouth: Mucous membranes are moist.  Cardiovascular:     Pulses: Normal pulses.     Heart sounds: Normal heart sounds. No murmur heard. No friction rub.  Pulmonary:     Effort: No respiratory distress.     Breath sounds: No stridor. No wheezing or rhonchi.  Skin:    Findings: No erythema.     Wt Readings from Last 3 Encounters:  11/27/20 222 lb (100.7 kg)  08/04/19 232 lb (105.2 kg)  06/14/19 224 lb (101.6 kg)    BP 120/80   Pulse 80   Ht 5\' 10"  (1.778 m)   Wt 222 lb (100.7 kg)   BMI 31.85 kg/m   Assessment and Plan: 1. Gastroesophageal reflux disease with esophagitis without hemorrhage Chronic.  Uncontrolled.  Stable.  Patient has not been taking his medication since December when he ran out.  We will resume his pantoprazole 40 mg once a day and will proceed with referral to gastroenterology for follow-up for below concern. - Ambulatory referral to Gastroenterology  2. History of Barrett's esophagus Patient had endoscopy and was noted to have Barrett's esophagitis.  Patient has been taking pantoprazole but is recently ran out and this is resumed.  This is timing that patient is to be reevaluated for his esophagitis and whether or not it is necessary to do further evaluation at this time patient denies any dysphagia and will see Dr. January for evaluation of whether the dosing will be proper or whether or not any further evaluation is necessary. - Ambulatory referral to Gastroenterology

## 2020-11-30 ENCOUNTER — Encounter: Payer: Self-pay | Admitting: Family Medicine

## 2020-11-30 ENCOUNTER — Ambulatory Visit (INDEPENDENT_AMBULATORY_CARE_PROVIDER_SITE_OTHER): Payer: BC Managed Care – PPO | Admitting: Family Medicine

## 2020-11-30 ENCOUNTER — Other Ambulatory Visit: Payer: Self-pay

## 2020-11-30 VITALS — BP 130/80 | HR 80 | Ht 70.0 in | Wt 225.0 lb

## 2020-11-30 DIAGNOSIS — Z0000 Encounter for general adult medical examination without abnormal findings: Secondary | ICD-10-CM | POA: Diagnosis not present

## 2020-11-30 DIAGNOSIS — R1013 Epigastric pain: Secondary | ICD-10-CM

## 2020-11-30 DIAGNOSIS — Z8719 Personal history of other diseases of the digestive system: Secondary | ICD-10-CM | POA: Diagnosis not present

## 2020-11-30 DIAGNOSIS — Z202 Contact with and (suspected) exposure to infections with a predominantly sexual mode of transmission: Secondary | ICD-10-CM | POA: Diagnosis not present

## 2020-11-30 DIAGNOSIS — Z6832 Body mass index (BMI) 32.0-32.9, adult: Secondary | ICD-10-CM

## 2020-11-30 NOTE — Progress Notes (Addendum)
Date:  11/30/2020   Name:  Haskel Dewalt Holloran   DOB:  [birth year]   MRN:  [id number]   Chief Complaint: Annual Exam  Patient is a 25 year old male who presents for a comprehensive physical exam. The patient reports the following problems:RUQ discomfort . Health maintenance has been reviewed up to date.   Lab Results  Component Value Date   CREATININE 0.97 07/20/2018   BUN 12 07/20/2018   NA 141 07/20/2018   K 3.8 07/20/2018   CL 104 07/20/2018   CO2 28 07/20/2018   No results found for: CHOL, HDL, LDLCALC, LDLDIRECT, TRIG, CHOLHDL No results found for: TSH No results found for: HGBA1C Lab Results  Component Value Date   WBC 7.6 07/20/2018   HGB 15.6 07/20/2018   HCT 45.6 07/20/2018   MCV 85.4 07/20/2018   PLT 162 07/20/2018   Lab Results  Component Value Date   ALT 36 07/20/2018   AST 24 07/20/2018   ALKPHOS 85 07/20/2018   BILITOT 0.8 07/20/2018     Review of Systems  Constitutional: Negative for chills and fever.  HENT: Negative for drooling, ear discharge, ear pain and sore throat.   Respiratory: Negative for cough, shortness of breath and wheezing.   Cardiovascular: Negative for chest pain, palpitations and leg swelling.  Gastrointestinal: Negative for abdominal pain, blood in stool, constipation, diarrhea and nausea.  Endocrine: Negative for polydipsia.  Genitourinary: Negative for dysuria, frequency, hematuria and urgency.  Musculoskeletal: Negative for back pain, myalgias and neck pain.  Skin: Negative for rash.  Allergic/Immunologic: Negative for environmental allergies.  Neurological: Negative for dizziness and headaches.  Hematological: Does not bruise/bleed easily.  Psychiatric/Behavioral: Negative for suicidal ideas. The patient is not nervous/anxious.     Patient Active Problem List   Diagnosis Date Noted  . Calculus of gallbladder without cholecystitis without obstruction   . Problems with swallowing and mastication   . Gastroesophageal reflux  disease   . Gastritis without bleeding   . 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 ESOPHAGOGASTRODUODENOSCOPY (EGD) WITH PROPOFOL N/A 11/07/2017   Procedure: ESOPHAGOGASTRODUODENOSCOPY (EGD) WITH PROPOFOL;  Surgeon: Midge Minium, MD;  Location: [facility];  Service: Endoscopy;  Laterality: N/A;  . FOOT FRACTURE SURGERY Right 05/2013  . HERNIA REPAIR     as infant  . ROBOTIC ASSISTED LAPAROSCOPIC CHOLECYSTECTOMY-MULTI SITE N/A 07/30/2018   Procedure: ROBOTIC ASSISTED LAPAROSCOPIC CHOLECYSTECTOMY-MULTI SITE;  Surgeon: Leafy Ro, MD;  Location: [facility];  Service: General;  Laterality: N/A;    Social History   Tobacco Use  . Smoking status: Passive Smoke Exposure - Never Smoker  . Smokeless tobacco: Never Used  Vaping Use  . Vaping Use: Never used  Substance Use Topics  . Alcohol use: Yes    Alcohol/week: 0.0 standard drinks    Comment: social  . Drug use: No     Medication list has been reviewed and updated.  Current Meds  Medication Sig  . pantoprazole (PROTONIX) 40 MG tablet Take 1 tablet (40 mg total) by mouth daily.    PHQ 2/9 Scores 11/27/2020 06/14/2019 03/30/2018 12/26/2015  PHQ - 2 Score 0 0 0 0  PHQ- 9 Score - 0 0 -    No flowsheet data found.  BP Readings from Last 3 Encounters:  11/30/20 130/80  11/27/20 120/80  08/04/19 (!) 148/98    Physical Exam Vitals and nursing note reviewed.  Constitutional:  Appearance: Normal appearance. He is well-developed, well-groomed and overweight.  HENT:     Head: Normocephalic.     Jaw: There is normal jaw occlusion.     Right Ear: Hearing, tympanic membrane, ear canal and external ear normal.     Left Ear: Hearing, tympanic membrane, ear canal and external ear normal.     Nose: Nose normal. No congestion or rhinorrhea.     Right Turbinates: Not enlarged.     Left Turbinates: Not enlarged.     Mouth/Throat:     Lips: Pink.      Mouth: Oropharynx is clear and moist. Mucous membranes are moist.     Dentition: Normal dentition.     Tongue: No lesions. Tongue does not deviate from midline.     Palate: No mass and lesions.     Pharynx: Oropharynx is clear. Uvula midline. No pharyngeal swelling, oropharyngeal exudate, posterior oropharyngeal erythema or uvula swelling.  Eyes:     General: Lids are normal. Vision grossly intact. Gaze aligned appropriately. No scleral icterus.       Right eye: No discharge.        Left eye: No discharge.     Extraocular Movements: Extraocular movements intact and EOM normal.     Conjunctiva/sclera: Conjunctivae normal.     Pupils: Pupils are equal, round, and reactive to light.     Funduscopic exam:    Right eye: Red reflex present.        Left eye: Red reflex present. Neck:     Thyroid: No thyroid mass, thyromegaly or thyroid tenderness.     Vascular: Normal carotid pulses. No carotid bruit, hepatojugular reflux or JVD.     Trachea: Trachea and phonation normal. No tracheal tenderness or tracheal deviation.  Cardiovascular:     Rate and Rhythm: Normal rate and regular rhythm.     Pulses: Normal pulses and intact distal pulses.          Carotid pulses are 2+ on the right side and 2+ on the left side.      Radial pulses are 2+ on the right side and 2+ on the left side.       Femoral pulses are 2+ on the right side and 2+ on the left side.      Popliteal pulses are 2+ on the right side and 2+ on the left side.       Dorsalis pedis pulses are 2+ on the right side and 2+ on the left side.       Posterior tibial pulses are 2+ on the right side and 2+ on the left side.     Heart sounds: Normal heart sounds, S1 normal and S2 normal. Heart sounds not distant. No murmur heard.  No systolic murmur is present.  No diastolic murmur is present. No friction rub. No gallop. No S3 or S4 sounds.   Pulmonary:     Effort: Pulmonary effort is normal. No respiratory distress.     Breath sounds: Normal  breath sounds and air entry. No decreased breath sounds, wheezing, rhonchi or rales.  Chest:     Chest wall: No mass.  Breasts: Breasts are symmetrical.     Right: Normal. No axillary adenopathy or supraclavicular adenopathy.     Left: Normal. No axillary adenopathy or supraclavicular adenopathy.    Abdominal:     General: Abdomen is flat. Bowel sounds are normal.     Palpations: Abdomen is soft. There is no hepatomegaly, splenomegaly, hepatosplenomegaly or mass.  Tenderness: There is no abdominal tenderness. There is no CVA tenderness, right CVA tenderness, left CVA tenderness, guarding or rebound.     Hernia: No hernia is present. There is no hernia in the umbilical area, ventral area, left inguinal area or right inguinal area.  Genitourinary:    Penis: Normal and circumcised.      Testes: Normal.        Right: Mass, tenderness, swelling, testicular hydrocele or varicocele not present.        Left: Mass, tenderness, swelling, testicular hydrocele or varicocele not present.     Epididymis:     Right: Normal.     Left: Normal.     Prostate: Normal. Not enlarged, not tender and no nodules present.     Rectum: Normal. Guaiac result negative. No external hemorrhoid.  Musculoskeletal:        General: No tenderness or edema. Normal range of motion.     Cervical back: Normal, full passive range of motion without pain, normal range of motion and neck supple.     Thoracic back: Normal.     Lumbar back: Normal.     Right lower leg: No edema.     Left lower leg: No edema.  Lymphadenopathy:     Head:     Right side of head: No submandibular or tonsillar adenopathy.     Left side of head: No submandibular or tonsillar adenopathy.     Cervical: No cervical adenopathy.     Right cervical: No superficial, deep or posterior cervical adenopathy.    Left cervical: No superficial, deep or posterior cervical adenopathy.     Upper Body:     Right upper body: No supraclavicular, axillary or  pectoral adenopathy.     Left upper body: No supraclavicular, axillary or pectoral adenopathy.     Lower Body: No right inguinal adenopathy. No left inguinal adenopathy.  Skin:    General: Skin is warm.     Capillary Refill: Capillary refill takes less than 2 seconds.     Coloration: Skin is not ashen.     Findings: No rash.  Neurological:     Mental Status: He is alert and oriented to person, place, and time.     Cranial Nerves: Cranial nerves are intact. No cranial nerve deficit.     Sensory: Sensation is intact.     Motor: Motor function is intact.     Deep Tendon Reflexes: Strength normal and reflexes are normal and symmetric.  Psychiatric:        Behavior: Behavior is cooperative.     Wt Readings from Last 3 Encounters:  11/30/20 225 lb (102.1 kg)  11/27/20 222 lb (100.7 kg)  08/04/19 232 lb (105.2 kg)    BP 130/80   Pulse 80   Ht 5\' 10"  (1.778 m)   Wt 225 lb (102.1 kg)   BMI 32.28 kg/m   Assessment and Plan: 1. Annual physical exam Patient's chart was reviewed for previous encounter.  There is no subjective/objective concerns noted during history and physical exam.Jhoel W Rhee is a 25 y.o. male who presents today for his Complete Annual Exam. He feels well. He reports exercising occasionally. He reports he is sleeping well. Immunizations are reviewed and recommendations provided.   Age appropriate screening tests are discussed. Counseling given for risk factor reduction interventions.  We will do lipid panel renal function panel - Lipid Panel With LDL/HDL Ratio - Renal Function Panel  2. History of Barrett's esophagus History of Barrett's esophagitis  which is followed by gastroenterology we will check guaiac test/which was negative. - CBC with Differential/Platelet  3. Midepigastric pain Patient has pain in the midepigastric area with a history of cholecystectomy.  This is likely to represent some scar tissue that may be remodeling since is been within a year.   We will check a lipase, hepatic function panel, and CBC. - Lipase - Hepatic Function Panel (6) - CBC with Differential/Platelet  4. Possible exposure to STD Patient with possible exposure to STD we will check an RPR and HIV for possible exposure - RPR - HIV Antibody (routine testing w rflx)  5. BMI 32.0-32.9,adult As noted above we will check a lipid panel and pending will likely suggested dietary approach to weight loss. - Lipid Panel With LDL/HDL Ratio

## 2020-12-01 LAB — RENAL FUNCTION PANEL
Albumin: 4.7 g/dL (ref 4.1–5.2)
BUN/Creatinine Ratio: 11 (ref 9–20)
BUN: 10 mg/dL (ref 6–20)
CO2: 21 mmol/L (ref 20–29)
Calcium: 9.5 mg/dL (ref 8.7–10.2)
Chloride: 103 mmol/L (ref 96–106)
Creatinine, Ser: 0.87 mg/dL (ref 0.76–1.27)
GFR calc Af Amer: 139 mL/min/{1.73_m2} (ref >59)
GFR calc non Af Amer: 120 mL/min/{1.73_m2} (ref >59)
Glucose: 81 mg/dL (ref 65–99)
Phosphorus: 3.4 mg/dL (ref 2.8–4.1)
Potassium: 4.1 mmol/L (ref 3.5–5.2)
Sodium: 143 mmol/L (ref 134–144)

## 2020-12-01 LAB — HEPATIC FUNCTION PANEL (6)
ALT: 69 IU/L — ABNORMAL HIGH (ref 0–44)
AST: 39 IU/L (ref 0–40)
Alkaline Phosphatase: 93 IU/L (ref 44–121)
Bilirubin Total: 0.7 mg/dL (ref 0.0–1.2)
Bilirubin, Direct: 0.15 mg/dL (ref 0.00–0.40)

## 2020-12-01 LAB — CBC WITH DIFFERENTIAL/PLATELET
Basophils Absolute: 0.1 10*3/uL (ref 0.0–0.2)
Basos: 1 %
EOS (ABSOLUTE): 0.1 10*3/uL (ref 0.0–0.4)
Eos: 2 %
Hematocrit: 49.9 % (ref 37.5–51.0)
Hemoglobin: 16.6 g/dL (ref 13.0–17.7)
Immature Grans (Abs): 0 10*3/uL (ref 0.0–0.1)
Immature Granulocytes: 1 %
Lymphocytes Absolute: 1.9 10*3/uL (ref 0.7–3.1)
Lymphs: 35 %
MCH: 29.1 pg (ref 26.6–33.0)
MCHC: 33.3 g/dL (ref 31.5–35.7)
MCV: 88 fL (ref 79–97)
Monocytes Absolute: 0.4 10*3/uL (ref 0.1–0.9)
Monocytes: 8 %
Neutrophils Absolute: 3 10*3/uL (ref 1.4–7.0)
Neutrophils: 53 %
Platelets: 190 10*3/uL (ref 150–450)
RBC: 5.7 x10E6/uL (ref 4.14–5.80)
RDW: 13.6 % (ref 11.6–15.4)
WBC: 5.5 10*3/uL (ref 3.4–10.8)

## 2020-12-01 LAB — RPR: RPR Ser Ql: NONREACTIVE

## 2020-12-01 LAB — LIPID PANEL WITH LDL/HDL RATIO
Cholesterol, Total: 206 mg/dL — ABNORMAL HIGH (ref 100–199)
HDL: 40 mg/dL (ref >39)
LDL Chol Calc (NIH): 140 mg/dL — ABNORMAL HIGH (ref 0–99)
LDL/HDL Ratio: 3.5 ratio (ref 0.0–3.6)
Triglycerides: 145 mg/dL (ref 0–149)
VLDL Cholesterol Cal: 26 mg/dL (ref 5–40)

## 2020-12-01 LAB — HIV ANTIBODY (ROUTINE TESTING W REFLEX): HIV Screen 4th Generation wRfx: NONREACTIVE

## 2020-12-01 LAB — LIPASE: Lipase: 22 U/L (ref 13–78)

## 2020-12-25 ENCOUNTER — Ambulatory Visit: Payer: BC Managed Care – PPO | Admitting: Gastroenterology

## 2020-12-25 ENCOUNTER — Encounter: Payer: Self-pay | Admitting: Gastroenterology

## 2020-12-25 ENCOUNTER — Other Ambulatory Visit: Payer: Self-pay

## 2020-12-25 VITALS — BP 108/66 | HR 85 | Temp 97.3°F | Ht 70.0 in | Wt 227.2 lb

## 2020-12-25 DIAGNOSIS — K219 Gastro-esophageal reflux disease without esophagitis: Secondary | ICD-10-CM | POA: Diagnosis not present

## 2020-12-25 NOTE — Progress Notes (Signed)
    Primary Care Physician: Duanne Limerick, MD  Primary Gastroenterologist:  Dr. Midge Minium  Chief Complaint  Patient presents with  . Medication Refill    dysphagia    HPI: Randy Farrell is a 25 y.o. male here for follow-up of his GERD.  The patient had a upper endoscopy with an irregular Z line but no sign of Barrett's at his last EGD.  The patient received a letter stating that there was signs of reflux but no cancerous or precancerous lesions.  The patient was under the impression that it was more serious than that. The patient says that he is well controlled on medication and quickly has symptoms if she stops his medication.  There is no report of any unexplained weight loss fevers chills nausea vomiting black stools or bloody stools.  The patient also denies any dysphagia.  Past Medical History:  Diagnosis Date  . GERD (gastroesophageal reflux disease)   . Sore throat 07/27/2018   to call surgeons office and inform them  . Wears contact lenses     Current Outpatient Medications  Medication Sig Dispense Refill  . pantoprazole (PROTONIX) 40 MG tablet Take 1 tablet (40 mg total) by mouth daily. 90 tablet 1   No current facility-administered medications for this visit.    Allergies as of 12/25/2020 - Review Complete 12/25/2020  Allergen Reaction Noted  . Amoxicillin Rash 12/25/2020    ROS:  General: Negative for anorexia, weight loss, fever, chills, fatigue, weakness. ENT: Negative for hoarseness, difficulty swallowing , nasal congestion. CV: Negative for chest pain, angina, palpitations, dyspnea on exertion, peripheral edema.  Respiratory: Negative for dyspnea at rest, dyspnea on exertion, cough, sputum, wheezing.  GI: See history of present illness. GU:  Negative for dysuria, hematuria, urinary incontinence, urinary frequency, nocturnal urination.  Endo: Negative for unusual weight change.    Physical Examination:   BP 108/66   Pulse 85   Temp (!) 97.3 F  (36.3 C) (Temporal)   Ht 5\' 10"  (1.778 m)   Wt 227 lb 3.2 oz (103.1 kg)   BMI 32.60 kg/m   General: Well-nourished, well-developed in no acute distress.  Eyes: No icterus. Conjunctivae pink. Neuro: Alert and oriented x 3.  Grossly intact. Skin: Warm and dry, no jaundice.   Psych: Alert and cooperative, normal mood and affect.  Labs:    Imaging Studies: No results found.  Assessment and Plan:   Randy Farrell is a 25 y.o. y/o male Who comes in today with a history of heartburn that comes back after he stops his medication.  The patient has been told that he can either continue his medication or consider antireflux surgery.  The patient has tried to stop his medication and quickly notices that his symptoms do come back.  The patient states he would like to stay on the medication at this time and he has been told the side effects of the medication.  The patient will contact me if any of his symptoms change or should he have any worrisome symptoms such as dysphagia black stools or bloody stools occur.  The patient will also contact me if he decides consider antireflux surgery.  The patient has been explained the plan and agrees with it.    22, MD. Midge Minium    Note: This dictation was prepared with Dragon dictation along with smaller phrase technology. Any transcriptional errors that result from this process are unintentional.

## 2021-11-02 ENCOUNTER — Other Ambulatory Visit: Payer: Self-pay

## 2021-11-02 ENCOUNTER — Encounter: Payer: Self-pay | Admitting: Family Medicine

## 2021-11-02 ENCOUNTER — Ambulatory Visit: Payer: BC Managed Care – PPO | Admitting: Family Medicine

## 2021-11-02 VITALS — BP 130/68 | HR 80 | Ht 70.0 in | Wt 235.0 lb

## 2021-11-02 DIAGNOSIS — J0110 Acute frontal sinusitis, unspecified: Secondary | ICD-10-CM | POA: Diagnosis not present

## 2021-11-02 MED ORDER — AZITHROMYCIN 250 MG PO TABS: ORAL_TABLET | ORAL | 0 refills | Status: AC

## 2021-11-02 NOTE — Progress Notes (Signed)
Date:  11/02/2021   Name:  Randy Farrell   DOB:  1996/03/17   MRN:  [id number]   Chief Complaint: Sinusitis (Headache, body aches, congestion, cough- dk green/ brown phlegm)  Sinusitis This is a new problem. The current episode started in the past 7 days (Sunday). The problem has been waxing and waning since onset. There has been no fever. The pain is mild. Associated symptoms include congestion, diaphoresis, ear pain, headaches, shortness of breath, sinus pressure, sneezing and a sore throat. Pertinent negatives include no chills, coughing, hoarse voice, neck pain or swollen glands. Past treatments include oral decongestants and acetaminophen. The treatment provided mild relief.   Lab Results  Component Value Date   NA 143 11/30/2020   K 4.1 11/30/2020   CO2 21 11/30/2020   GLUCOSE 81 11/30/2020   BUN 10 11/30/2020   CREATININE 0.87 11/30/2020   CALCIUM 9.5 11/30/2020   GFRNONAA 120 11/30/2020   Lab Results  Component Value Date   CHOL 206 (H) 11/30/2020   HDL 40 11/30/2020   LDLCALC 140 (H) 11/30/2020   TRIG 145 11/30/2020   No results found for: TSH No results found for: HGBA1C Lab Results  Component Value Date   WBC 5.5 11/30/2020   HGB 16.6 11/30/2020   HCT 49.9 11/30/2020   MCV 88 11/30/2020   PLT 190 11/30/2020   Lab Results  Component Value Date   ALT 69 (H) 11/30/2020   AST 39 11/30/2020   ALKPHOS 93 11/30/2020   BILITOT 0.7 11/30/2020   No results found for: 25OHVITD2, 25OHVITD3, VD25OH   Review of Systems  Constitutional:  Positive for diaphoresis. Negative for chills and fever.  HENT:  Positive for congestion, ear pain, sinus pressure, sneezing and sore throat. Negative for drooling, ear discharge, hoarse voice and postnasal drip.   Respiratory:  Positive for shortness of breath. Negative for cough and wheezing.   Cardiovascular:  Negative for chest pain, palpitations and leg swelling.  Gastrointestinal:  Negative for abdominal pain, blood in  stool, constipation, diarrhea and nausea.  Endocrine: Negative for polydipsia.  Genitourinary:  Negative for dysuria, frequency, hematuria and urgency.  Musculoskeletal:  Negative for back pain, myalgias and neck pain.  Skin:  Negative for rash.  Allergic/Immunologic: Negative for environmental allergies.  Neurological:  Positive for headaches. Negative for dizziness.  Hematological:  Does not bruise/bleed easily.  Psychiatric/Behavioral:  Negative for suicidal ideas. The patient is not nervous/anxious.    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/04/2013    Allergies  Allergen Reactions   Amoxicillin Rash    Past Surgical History:  Procedure Laterality Date   ESOPHAGOGASTRODUODENOSCOPY (EGD) WITH PROPOFOL N/A 11/07/2017   Procedure: ESOPHAGOGASTRODUODENOSCOPY (EGD) WITH PROPOFOL;  Surgeon: Lucilla Lame, MD;  Location: [place];  Service: Endoscopy;  Laterality: N/A;   FOOT FRACTURE SURGERY Right 05/2013   HERNIA REPAIR     as infant   ROBOTIC ASSISTED LAPAROSCOPIC CHOLECYSTECTOMY-MULTI SITE N/A 07/30/2018   Procedure: ROBOTIC ASSISTED LAPAROSCOPIC CHOLECYSTECTOMY-MULTI SITE;  Surgeon: Jules Husbands, MD;  Location: [facility];  Service: General;  Laterality: N/A;    Social History   Tobacco Use   Smoking status: Never    Passive exposure: Yes   Smokeless tobacco: Never  Vaping Use   Vaping  Use: Never used  Substance Use Topics   Alcohol use: Yes    Alcohol/week: 0.0 standard drinks    Comment: social   Drug use: No     Medication list has been reviewed and updated.  No outpatient medications have been marked as taking for the 11/02/21 encounter (Office Visit) with Juline Patch, MD.    [facility] 2/9 Scores 11/02/2021 11/27/2020 06/14/2019 03/30/2018   PHQ - 2 Score 0 0 0 0  PHQ- 9 Score 0 - 0 0    GAD 7 : Generalized Anxiety Score 11/02/2021  Nervous, Anxious, on Edge 0  Control/stop worrying 0  Worry too much - different things 0  Trouble relaxing 0  Restless 0  Easily annoyed or irritable 0  Afraid - awful might happen 0  Total GAD 7 Score 0  Anxiety Difficulty Not difficult at all    BP Readings from Last 3 Encounters:  11/02/21 130/68  12/25/20 108/66  11/30/20 130/80    Physical Exam Vitals and nursing note reviewed.  HENT:     Head: Normocephalic.     Jaw: There is normal jaw occlusion.     Right Ear: Ear canal and external ear normal. Tympanic membrane is retracted.     Left Ear: Ear canal and external ear normal. Tympanic membrane is retracted.     Nose: Mucosal edema present. No congestion or rhinorrhea.     Right Turbinates: Swollen.     Left Turbinates: Swollen.     Right Sinus: Frontal sinus tenderness present. No maxillary sinus tenderness.     Left Sinus: Frontal sinus tenderness present. No maxillary sinus tenderness.  Eyes:     General: No scleral icterus.       Right eye: No discharge.        Left eye: No discharge.     Conjunctiva/sclera: Conjunctivae normal.     Pupils: Pupils are equal, round, and reactive to light.  Neck:     Thyroid: No thyromegaly.     Vascular: No JVD.     Trachea: No tracheal deviation.  Cardiovascular:     Rate and Rhythm: Normal rate and regular rhythm.     Pulses: Normal pulses.     Heart sounds: Normal heart sounds, S1 normal and S2 normal. No murmur heard. No systolic murmur is present.  No diastolic murmur is present.    No friction rub. No gallop. No S3 sounds.  Pulmonary:     Effort: No respiratory distress.     Breath sounds: Normal breath sounds. No decreased breath sounds, wheezing, rhonchi or rales.  Abdominal:     General: Bowel sounds are normal.     Palpations: Abdomen is soft. There is no mass.     Tenderness: There is no abdominal tenderness. There  is no guarding or rebound.  Musculoskeletal:        General: No tenderness. Normal range of motion.     Cervical back: Normal range of motion and neck supple.  Lymphadenopathy:     Cervical: No cervical adenopathy.     Right cervical: No superficial, deep or posterior cervical adenopathy.    Left cervical: No superficial, deep or posterior cervical adenopathy.  Skin:    General: Skin is warm.     Findings: No rash.  Neurological:     Mental Status: He is alert and oriented to person, place, and time.     Cranial Nerves: No cranial nerve deficit.     Deep Tendon Reflexes: Reflexes are  normal and symmetric.    Wt Readings from Last 3 Encounters:  11/02/21 235 lb (106.6 kg)  12/25/20 227 lb 3.2 oz (103.1 kg)  11/30/20 225 lb (102.1 kg)    BP 130/68    Pulse 80    Ht 5\' 10"  (1.778 m)    Wt 235 lb (106.6 kg)    BMI 33.72 kg/m   Assessment and Plan:  1. Acute non-recurrent frontal sinusitis New onset.  Persistent.  Patient has been symptomatic for the entire week and there is tenderness noted over the frontal sinuses bilateral.  History and exam is consistent with a residual frontal sinusitis and will treat with azithromycin to 50 mg 2 today followed by 1 a day for 4 days. - azithromycin (ZITHROMAX) 250 MG tablet; Take 2 tablets on day 1, then 1 tablet daily on days 2 through 5  Dispense: 6 tablet; Refill: 0

## 2022-02-21 ENCOUNTER — Ambulatory Visit: Payer: BC Managed Care – PPO | Admitting: Family Medicine

## 2022-02-21 ENCOUNTER — Encounter: Payer: Self-pay | Admitting: Family Medicine

## 2022-02-21 VITALS — BP 120/80 | HR 90 | Ht 70.0 in | Wt 229.0 lb

## 2022-02-21 DIAGNOSIS — F5101 Primary insomnia: Secondary | ICD-10-CM

## 2022-02-21 DIAGNOSIS — G4733 Obstructive sleep apnea (adult) (pediatric): Secondary | ICD-10-CM | POA: Diagnosis not present

## 2022-02-21 MED ORDER — TRAZODONE HCL 50 MG PO TABS: 25.0000 mg | ORAL_TABLET | Freq: Every evening | ORAL | 3 refills | Status: DC | PRN

## 2022-02-21 NOTE — Progress Notes (Signed)
? ? ?Date:  02/21/2022  ? ?Name:  Randy Farrell   DOB:  Jul 29, 1996   MRN:  [id number] ? ? ?Chief Complaint: Insomnia and Sleep Apnea ? ?.dj ? ?Insomnia ?Primary symptoms: fragmented sleep, somnolence, malaise/fatigue, napping.   ?The current episode started more than one year. The onset quality is gradual. The problem occurs intermittently. The problem has been waxing and waning since onset. Exacerbated by: sleep apnea. The treatment provided moderate relief. PMH includes: associated symptoms present, no hypertension, no depression, no restless leg syndrome.   ? ?Lab Results  ?Component Value Date  ? NA 143 11/30/2020  ? K 4.1 11/30/2020  ? CO2 21 11/30/2020  ? GLUCOSE 81 11/30/2020  ? BUN 10 11/30/2020  ? CREATININE 0.87 11/30/2020  ? CALCIUM 9.5 11/30/2020  ? GFRNONAA 120 11/30/2020  ? ?Lab Results  ?Component Value Date  ? CHOL 206 (H) 11/30/2020  ? HDL 40 11/30/2020  ? LDLCALC 140 (H) 11/30/2020  ? TRIG 145 11/30/2020  ? ?No results found for: TSH ?No results found for: HGBA1C ?Lab Results  ?Component Value Date  ? WBC 5.5 11/30/2020  ? HGB 16.6 11/30/2020  ? HCT 49.9 11/30/2020  ? MCV 88 11/30/2020  ? PLT 190 11/30/2020  ? ?Lab Results  ?Component Value Date  ? ALT 69 (H) 11/30/2020  ? AST 39 11/30/2020  ? ALKPHOS 93 11/30/2020  ? BILITOT 0.7 11/30/2020  ? ?No results found for: 25OHVITD2, 25OHVITD3, VD25OH  ? ?Review of Systems  ?Constitutional:  Positive for malaise/fatigue. Negative for chills and fever.  ?HENT:  Negative for drooling, ear discharge, ear pain and sore throat.   ?Respiratory:  Negative for cough, shortness of breath and wheezing.   ?Cardiovascular:  Negative for chest pain, palpitations and leg swelling.  ?Gastrointestinal:  Negative for abdominal pain, blood in stool, constipation, diarrhea and nausea.  ?Endocrine: Negative for polydipsia.  ?Genitourinary:  Negative for dysuria, frequency, hematuria and urgency.  ?Musculoskeletal:  Negative for back pain, myalgias and neck pain.  ?Skin:   Negative for rash.  ?Allergic/Immunologic: Negative for environmental allergies.  ?Neurological:  Negative for dizziness and headaches.  ?Hematological:  Does not bruise/bleed easily.  ?Psychiatric/Behavioral:  Negative for depression and suicidal ideas. The patient has insomnia. The patient is not nervous/anxious.   ? ?Patient Active Problem List  ? Diagnosis Date Noted  ? Calculus of gallbladder without cholecystitis without obstruction   ? Problems with swallowing and mastication   ? Gastroesophageal reflux disease   ? Gastritis without bleeding   ? Lisfranc's dislocation 08/04/2013  ? ? ?Allergies  ?Allergen Reactions  ? Amoxicillin Rash  ? ? ?Past Surgical History:  ?Procedure Laterality Date  ? ESOPHAGOGASTRODUODENOSCOPY (EGD) WITH PROPOFOL N/A 11/07/2017  ? Procedure: ESOPHAGOGASTRODUODENOSCOPY (EGD) WITH PROPOFOL;  Surgeon: Midge MiniumWohl, Darren, MD;  [facility];  Service: Endoscopy;  Laterality: N/A;  ? FOOT FRACTURE SURGERY Right 05/2013  ? HERNIA REPAIR    ? as infant  ? ROBOTIC ASSISTED LAPAROSCOPIC CHOLECYSTECTOMY-MULTI SITE N/A 07/30/2018  ? Procedure: ROBOTIC ASSISTED LAPAROSCOPIC CHOLECYSTECTOMY-MULTI SITE;  Surgeon: Leafy RoPabon, Diego F, MD;  Location: [facility];  Service: General;  Laterality: N/A;  ? ? ?Social History  ? ?Tobacco Use  ? Smoking status: Never  ?  Passive exposure: Yes  ? Smokeless tobacco: Never  ?Vaping Use  ? Vaping Use: Never used  ?Substance Use Topics  ? Alcohol use: Yes  ?  Alcohol/week: 0.0 standard drinks  ?  Comment: social  ? Drug use: No  ? ? ? ?  Medication list has been reviewed and updated. ? ?No outpatient medications have been marked as taking for the 02/21/22 encounter (Office Visit) with Duanne Limerick, MD.  ? ? ? ?  02/21/2022  ?  2:48 PM 11/02/2021  ? 11:07 AM  ?GAD 7 : Generalized Anxiety Score  ?Nervous, Anxious, on Edge 1 0  ?Control/stop worrying 1 0  ?Worry too much - different things 2 0  ?Trouble relaxing 0 0  ?Restless 0 0  ?Easily annoyed or irritable 2 0   ?Afraid - awful might happen 1 0  ?Total GAD 7 Score 7 0  ?Anxiety Difficulty Very difficult Not difficult at all  ? ? ? ?  02/21/2022  ?  2:48 PM  ?Depression screen PHQ 2/9  ?Decreased Interest 2  ?Down, Depressed, Hopeless 0  ?PHQ - 2 Score 2  ?Altered sleeping 3  ?Tired, decreased energy 2  ?Change in appetite 1  ?Feeling bad or failure about yourself  1  ?Trouble concentrating 1  ?Moving slowly or fidgety/restless 1  ?Suicidal thoughts 0  ?PHQ-9 Score 11  ?Difficult doing work/chores Extremely dIfficult  ? ? ?BP Readings from Last 3 Encounters:  ?02/21/22 120/80  ?11/02/21 130/68  ?12/25/20 108/66  ? ? ?Physical Exam ?Vitals and nursing note reviewed.  ?HENT:  ?   Head: Normocephalic.  ?   Right Ear: Tympanic membrane and external ear normal.  ?   Left Ear: Tympanic membrane and external ear normal.  ?   Nose: Nose normal. No congestion or rhinorrhea.  ?   Mouth/Throat:  ?   Mouth: Mucous membranes are moist.  ?   Pharynx: No oropharyngeal exudate or posterior oropharyngeal erythema.  ?Eyes:  ?   General: No scleral icterus.    ?   Right eye: No discharge.     ?   Left eye: No discharge.  ?   Conjunctiva/sclera: Conjunctivae normal.  ?   Pupils: Pupils are equal, round, and reactive to light.  ?Neck:  ?   Thyroid: No thyromegaly.  ?   Vascular: No JVD.  ?   Trachea: No tracheal deviation.  ?Cardiovascular:  ?   Rate and Rhythm: Normal rate and regular rhythm.  ?   Heart sounds: Normal heart sounds. No murmur heard. ?  No friction rub. No gallop.  ?Pulmonary:  ?   Effort: No respiratory distress.  ?   Breath sounds: Normal breath sounds. No wheezing, rhonchi or rales.  ?Abdominal:  ?   General: Bowel sounds are normal.  ?   Palpations: Abdomen is soft. There is no mass.  ?   Tenderness: There is no abdominal tenderness. There is no guarding or rebound.  ?Musculoskeletal:     ?   General: No tenderness. Normal range of motion.  ?   Cervical back: Normal range of motion and neck supple.  ?Lymphadenopathy:  ?    Cervical: No cervical adenopathy.  ?Skin: ?   General: Skin is warm.  ?   Findings: No rash.  ?Neurological:  ?   Mental Status: He is alert and oriented to person, place, and time.  ?   Cranial Nerves: No cranial nerve deficit.  ?   Deep Tendon Reflexes: Reflexes are normal and symmetric.  ? ? ?Wt Readings from Last 3 Encounters:  ?02/21/22 229 lb (103.9 kg)  ?11/02/21 235 lb (106.6 kg)  ?12/25/20 227 lb 3.2 oz (103.1 kg)  ? ? ?BP 120/80   Pulse 90   Ht 5\' 10"  (1.778  m)   Wt 229 lb (103.9 kg)   BMI 32.86 kg/m?  ? ?Assessment and Plan: ?1. Primary insomniaChronic.   ?Chronic.  Uncontrolled.  Episodic.  Patient having insomnia which is seemingly more fragmented sleep than initiating sleep.  We will treat with trazodone 50 mg 1/2 to 1 tablet nightly and refill as needed.- traZODone (DESYREL) 50 MG tablet; Take 0.5-1 tablets (25-50 mg total) by mouth at bedtime as needed for sleep.  Dispense: 30 tablet; Refill: 3 ? ?2. Obstructive sleep apnea ?Newly observed pattern with a pattern of loud snoring.  Parent has witnessed apneic episodes.  We will refer to ear nose and throat to initiate sleep study in the meantime I have suggested the patient to not sleep on his back. ?- Ambulatory referral to ENT  ? ? ?

## 2022-02-27 ENCOUNTER — Ambulatory Visit: Payer: BC Managed Care – PPO | Admitting: Family Medicine

## 2022-02-27 ENCOUNTER — Encounter: Payer: Self-pay | Admitting: Family Medicine

## 2022-02-27 VITALS — BP 120/80 | HR 124 | Temp 101.5°F | Ht 70.0 in | Wt 229.0 lb

## 2022-02-27 DIAGNOSIS — J029 Acute pharyngitis, unspecified: Secondary | ICD-10-CM

## 2022-02-27 DIAGNOSIS — R110 Nausea: Secondary | ICD-10-CM

## 2022-02-27 DIAGNOSIS — R509 Fever, unspecified: Secondary | ICD-10-CM | POA: Diagnosis not present

## 2022-02-27 LAB — POC COVID19 BINAXNOW: SARS Coronavirus 2 Ag: NEGATIVE

## 2022-02-27 LAB — POCT RAPID STREP A (OFFICE): Rapid Strep A Screen: NEGATIVE

## 2022-02-27 MED ORDER — AZITHROMYCIN 250 MG PO TABS: ORAL_TABLET | ORAL | 0 refills | Status: AC

## 2022-02-27 MED ORDER — ONDANSETRON HCL 4 MG PO TABS: 4.0000 mg | ORAL_TABLET | Freq: Three times a day (TID) | ORAL | 0 refills | Status: DC | PRN

## 2022-02-27 NOTE — Progress Notes (Signed)
? ? ?Date:  02/27/2022  ? ?Name:  Randy Farrell   DOB:  [birth year]   MRN:  [id number] ? ? ?Chief Complaint: Sore Throat (Fever, blood tinged sputum, vomitting off and on for a month. Has been traveling out of town. Symptoms worsened yesterday) ? ?Sore Throat  ?This is a recurrent (5weeks) problem. The current episode started more than 1 month ago. The problem has been waxing and waning. The maximum temperature recorded prior to his arrival was 102 - 102.9 F. The pain is mild. Pertinent negatives include no abdominal pain, congestion, coughing, diarrhea, drooling, ear discharge, ear pain, headaches, plugged ear sensation, neck pain or shortness of breath. He has had no exposure to strep or mono.  ? ?Lab Results  ?Component Value Date  ? NA 143 11/30/2020  ? K 4.1 11/30/2020  ? CO2 21 11/30/2020  ? GLUCOSE 81 11/30/2020  ? BUN 10 11/30/2020  ? CREATININE 0.87 11/30/2020  ? CALCIUM 9.5 11/30/2020  ? GFRNONAA 120 11/30/2020  ? ?Lab Results  ?Component Value Date  ? CHOL 206 (H) 11/30/2020  ? HDL 40 11/30/2020  ? LDLCALC 140 (H) 11/30/2020  ? TRIG 145 11/30/2020  ? ?No results found for: TSH ?No results found for: HGBA1C ?Lab Results  ?Component Value Date  ? WBC 5.5 11/30/2020  ? HGB 16.6 11/30/2020  ? HCT 49.9 11/30/2020  ? MCV 88 11/30/2020  ? PLT 190 11/30/2020  ? ?Lab Results  ?Component Value Date  ? ALT 69 (H) 11/30/2020  ? AST 39 11/30/2020  ? ALKPHOS 93 11/30/2020  ? BILITOT 0.7 11/30/2020  ? ?No results found for: 25OHVITD2, 25OHVITD3, VD25OH  ? ?Review of Systems  ?Constitutional:  Negative for chills and fever.  ?HENT:  Negative for congestion, drooling, ear discharge, ear pain and sore throat.   ?Respiratory:  Negative for cough, shortness of breath and wheezing.   ?Cardiovascular:  Negative for chest pain, palpitations and leg swelling.  ?Gastrointestinal:  Negative for abdominal pain, blood in stool, constipation, diarrhea and nausea.  ?Endocrine: Negative for polydipsia.  ?Genitourinary:  Negative for  dysuria, frequency, hematuria and urgency.  ?Musculoskeletal:  Negative for back pain, myalgias and neck pain.  ?Skin:  Negative for rash.  ?Allergic/Immunologic: Negative for environmental allergies.  ?Neurological:  Negative for dizziness and headaches.  ?Hematological:  Does not bruise/bleed easily.  ?Psychiatric/Behavioral:  Negative for suicidal ideas. The patient is not nervous/anxious.   ? ?Patient Active Problem List  ? Diagnosis Date Noted  ? Calculus of gallbladder without cholecystitis without obstruction   ? Problems with swallowing and mastication   ? Gastroesophageal reflux disease   ? Gastritis without bleeding   ? Lisfranc's dislocation 08/04/2013  ? ? ?Allergies  ?Allergen Reactions  ? Amoxicillin Rash  ? ? ?Past Surgical History:  ?Procedure Laterality Date  ? ESOPHAGOGASTRODUODENOSCOPY (EGD) WITH PROPOFOL N/A 11/07/2017  ? Procedure: ESOPHAGOGASTRODUODENOSCOPY (EGD) WITH PROPOFOL;  Surgeon: Midge Minium, MD;  Location: [facility];  Service: Endoscopy;  Laterality: N/A;  ? FOOT FRACTURE SURGERY Right 05/2013  ? HERNIA REPAIR    ? as infant  ? ROBOTIC ASSISTED LAPAROSCOPIC CHOLECYSTECTOMY-MULTI SITE N/A 07/30/2018  ? Procedure: ROBOTIC ASSISTED LAPAROSCOPIC CHOLECYSTECTOMY-MULTI SITE;  Surgeon: Leafy Ro, MD;  Location: [facility];  Service: General;  Laterality: N/A;  ? ? ?Social History  ? ?Tobacco Use  ? Smoking status: Never  ?  Passive exposure: Yes  ? Smokeless tobacco: Never  ?Vaping Use  ? Vaping Use: Never used  ?Substance Use  Topics  ? Alcohol use: Yes  ?  Alcohol/week: 0.0 standard drinks  ?  Comment: social  ? Drug use: No  ? ? ? ?Medication list has been reviewed and updated. ? ?Current Meds  ?Medication Sig  ? traZODone (DESYREL) 50 MG tablet Take 0.5-1 tablets (25-50 mg total) by mouth at bedtime as needed for sleep.  ? ? ? ?  02/21/2022  ?  2:48 PM 11/02/2021  ? 11:07 AM  ?GAD 7 : Generalized Anxiety Score  ?Nervous, Anxious, on Edge 1 0  ?Control/stop worrying 1 0  ?Worry  too much - different things 2 0  ?Trouble relaxing 0 0  ?Restless 0 0  ?Easily annoyed or irritable 2 0  ?Afraid - awful might happen 1 0  ?Total GAD 7 Score 7 0  ?Anxiety Difficulty Very difficult Not difficult at all  ? ? ? ?  02/21/2022  ?  2:48 PM  ?Depression screen PHQ 2/9  ?Decreased Interest 2  ?Down, Depressed, Hopeless 0  ?PHQ - 2 Score 2  ?Altered sleeping 3  ?Tired, decreased energy 2  ?Change in appetite 1  ?Feeling bad or failure about yourself  1  ?Trouble concentrating 1  ?Moving slowly or fidgety/restless 1  ?Suicidal thoughts 0  ?PHQ-9 Score 11  ?Difficult doing work/chores Extremely dIfficult  ? ? ?BP Readings from Last 3 Encounters:  ?02/27/22 120/80  ?02/21/22 120/80  ?11/02/21 130/68  ? ? ?Physical Exam ?Vitals and nursing note reviewed.  ?HENT:  ?   Right Ear: Tympanic membrane and ear canal normal.  ?   Left Ear: Tympanic membrane and ear canal normal.  ?   Nose: Congestion present.  ?   Right Turbinates: Swollen.  ?   Left Turbinates: Swollen.  ?   Right Sinus: No maxillary sinus tenderness or frontal sinus tenderness.  ?   Left Sinus: No maxillary sinus tenderness or frontal sinus tenderness.  ?   Mouth/Throat:  ?   Pharynx: Pharyngeal swelling, oropharyngeal exudate and posterior oropharyngeal erythema present. No uvula swelling.  ?   Tonsils: Tonsillar exudate present.  ?Cardiovascular:  ?   Heart sounds: S1 normal and S2 normal.  ?No systolic murmur is present.  ?No diastolic murmur is present.  ?  No S3 or S4 sounds.  ?Lymphadenopathy:  ?   Head:  ?   Right side of head: Submandibular and tonsillar adenopathy present.  ?   Left side of head: Submandibular and tonsillar adenopathy present.  ?   Cervical: No cervical adenopathy.  ?   Right cervical: No superficial, deep or posterior cervical adenopathy. ?   Left cervical: No superficial, deep or posterior cervical adenopathy.  ? ? ?Wt Readings from Last 3 Encounters:  ?02/27/22 229 lb (103.9 kg)  ?02/21/22 229 lb (103.9 kg)  ?11/02/21 235  lb (106.6 kg)  ? ? ?BP 120/80   Pulse (!) 124   Temp (!) 101.5 ?F (38.6 ?C) (Oral)   Ht 5\' 10"  (1.778 m)   Wt 229 lb (103.9 kg)   BMI 32.86 kg/m?  ? ?Assessment and Plan: ? ?1. Fever, unspecified fever cause ?New onset.  Persistent.  Patient's not been feeling well and has been traveling.  COVID is negative and rapid strep is negative.  We will do a group A strep and that the pharynx is extremely red with exudate in the tonsillar area.  We will also check a group A strep culture and Monospot.  We will initiate azithromycin and that this may  be sinus and that he has been coughing up which may be more of a postnasal drainage which is purulent in nature that are seen when he has been spitting in the bag. ?- POC COVID-19 ?- Grp A Strep ?- Monospot ?- azithromycin (ZITHROMAX) 250 MG tablet; Take 2 tablets on day 1, then 1 tablet daily on days 2 through 5  Dispense: 6 tablet; Refill: 0 ?- Culture, Group A Strep ? ?2. Sore throat ?As noted above ?- POCT rapid strep A ?- Grp A Strep ?- Monospot ?- azithromycin (ZITHROMAX) 250 MG tablet; Take 2 tablets on day 1, then 1 tablet daily on days 2 through 5  Dispense: 6 tablet; Refill: 0 ?- Culture, Group A Strep ? ?3. Pharyngitis, unspecified etiology ?As noted above ?- Grp A Strep ?- Monospot ?- azithromycin (ZITHROMAX) 250 MG tablet; Take 2 tablets on day 1, then 1 tablet daily on days 2 through 5  Dispense: 6 tablet; Refill: 0 ?- Culture, Group A Strep ? ?4. Nausea ?During the course of the coughing and the drainage patient got nauseated and had emesis in the office.  I think this is due to the purulent nature of the sputum.  We will treat with Zofran 4 mg every 8 hours as needed nausea. ?- ondansetron (ZOFRAN) 4 MG tablet; Take 1 tablet (4 mg total) by mouth every 8 (eight) hours as needed for nausea or vomiting.  Dispense: 20 tablet; Refill: 0  ? ? ?

## 2022-02-28 ENCOUNTER — Encounter: Payer: Self-pay | Admitting: Family Medicine

## 2022-02-28 LAB — MONONUCLEOSIS SCREEN: Mono Screen: NEGATIVE

## 2022-03-03 LAB — CULTURE, GROUP A STREP

## 2022-03-22 ENCOUNTER — Ambulatory Visit: Payer: BC Managed Care – PPO | Admitting: Family Medicine

## 2022-03-22 ENCOUNTER — Encounter: Payer: Self-pay | Admitting: Family Medicine

## 2022-03-22 VITALS — BP 138/80 | HR 80 | Ht 70.0 in | Wt 222.0 lb

## 2022-03-22 DIAGNOSIS — Z114 Encounter for screening for human immunodeficiency virus [HIV]: Secondary | ICD-10-CM | POA: Diagnosis not present

## 2022-03-22 DIAGNOSIS — E782 Mixed hyperlipidemia: Secondary | ICD-10-CM

## 2022-03-22 DIAGNOSIS — A689 Relapsing fever, unspecified: Secondary | ICD-10-CM

## 2022-03-22 DIAGNOSIS — Z1159 Encounter for screening for other viral diseases: Secondary | ICD-10-CM | POA: Diagnosis not present

## 2022-03-22 DIAGNOSIS — R1012 Left upper quadrant pain: Secondary | ICD-10-CM

## 2022-03-22 DIAGNOSIS — R748 Abnormal levels of other serum enzymes: Secondary | ICD-10-CM | POA: Diagnosis not present

## 2022-03-22 DIAGNOSIS — E7800 Pure hypercholesterolemia, unspecified: Secondary | ICD-10-CM | POA: Diagnosis not present

## 2022-03-22 NOTE — Progress Notes (Signed)
Date:  03/22/2022   Name:  Randy Farrell   DOB:  Oct 02, 1996   MRN:  [id number]   Chief Complaint: lab work  Fever  This is a recurrent problem. The current episode started more than 1 year ago. The problem occurs intermittently. The problem has been waxing and waning. Maximum temperature: low grade. Associated symptoms include abdominal pain and diarrhea. Pertinent negatives include no chest pain, congestion, coughing, ear pain, headaches, muscle aches, nausea, rash, sleepiness, sore throat, urinary pain, vomiting or wheezing. Associated symptoms comments: LUQ.  Risk factors: recent travel     Lab Results  Component Value Date   NA 143 11/30/2020   K 4.1 11/30/2020   CO2 21 11/30/2020   GLUCOSE 81 11/30/2020   BUN 10 11/30/2020   CREATININE 0.87 11/30/2020   CALCIUM 9.5 11/30/2020   GFRNONAA 120 11/30/2020   Lab Results  Component Value Date   CHOL 206 (H) 11/30/2020   HDL 40 11/30/2020   LDLCALC 140 (H) 11/30/2020   TRIG 145 11/30/2020   No results found for: "TSH" No results found for: "HGBA1C" Lab Results  Component Value Date   WBC 5.5 11/30/2020   HGB 16.6 11/30/2020   HCT 49.9 11/30/2020   MCV 88 11/30/2020   PLT 190 11/30/2020   Lab Results  Component Value Date   ALT 69 (H) 11/30/2020   AST 39 11/30/2020   ALKPHOS 93 11/30/2020   BILITOT 0.7 11/30/2020   No results found for: "25OHVITD2", "25OHVITD3", "VD25OH"   Review of Systems  Constitutional:  Positive for fever. Negative for chills.  HENT:  Negative for congestion, drooling, ear discharge, ear pain and sore throat.   Respiratory:  Negative for cough, shortness of breath and wheezing.   Cardiovascular:  Negative for chest pain, palpitations and leg swelling.  Gastrointestinal:  Positive for abdominal pain and diarrhea. Negative for blood in stool, constipation, nausea and vomiting.  Endocrine: Negative for polydipsia.  Genitourinary:  Negative for dysuria, frequency, hematuria and urgency.   Musculoskeletal:  Negative for back pain, myalgias and neck pain.  Skin:  Negative for rash.  Allergic/Immunologic: Negative for environmental allergies.  Neurological:  Negative for dizziness and headaches.  Hematological:  Does not bruise/bleed easily.  Psychiatric/Behavioral:  Negative for suicidal ideas. The patient is not nervous/anxious.     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/04/2013    Allergies  Allergen Reactions   Amoxicillin Rash    Past Surgical History:  Procedure Laterality Date   ESOPHAGOGASTRODUODENOSCOPY (EGD) WITH PROPOFOL N/A 11/07/2017   Procedure: ESOPHAGOGASTRODUODENOSCOPY (EGD) WITH PROPOFOL;  Surgeon: Midge MiniumWohl, Darren, MD;  [facility];  Service: Endoscopy;  Laterality: N/A;   FOOT FRACTURE SURGERY Right 05/2013   HERNIA REPAIR     as infant   ROBOTIC ASSISTED LAPAROSCOPIC CHOLECYSTECTOMY-MULTI SITE N/A 07/30/2018   Procedure: ROBOTIC ASSISTED LAPAROSCOPIC CHOLECYSTECTOMY-MULTI SITE;  Surgeon: Leafy RoPabon, Diego F, MD;  Location: [facility];  Service: General;  Laterality: N/A;    Social History   Tobacco Use   Smoking status: Never    Passive exposure: Yes   Smokeless tobacco: Never  Vaping Use   Vaping Use: Never used  Substance Use Topics   Alcohol use: Yes    Alcohol/week: 0.0 standard drinks of alcohol  Comment: social   Drug use: No     Medication list has been reviewed and updated.  No outpatient medications have been marked as taking for the 03/22/22 encounter (Office Visit) with Duanne Limerick, MD.       03/22/2022    2:36 PM 02/21/2022    2:48 PM 11/02/2021   11:07 AM  GAD 7 : Generalized Anxiety Score  Nervous, Anxious, on Edge 0 1 0  Control/stop worrying 0 1 0  Worry too much - different things 1  2 0  Trouble relaxing 0 0 0  Restless 0 0 0  Easily annoyed or irritable 1 2 0  Afraid - awful might happen 1 1 0  Total GAD 7 Score 3 7 0  Anxiety Difficulty Not difficult at all Very difficult Not difficult at all       03/22/2022    2:36 PM  Depression screen PHQ 2/9  Decreased Interest 1  Down, Depressed, Hopeless 1  PHQ - 2 Score 2  Altered sleeping 1  Tired, decreased energy 1  Change in appetite 2  Feeling bad or failure about yourself  1  Trouble concentrating 0  Moving slowly or fidgety/restless 0  Suicidal thoughts 0  PHQ-9 Score 7  Difficult doing work/chores Not difficult at all    BP Readings from Last 3 Encounters:  03/22/22 138/80  02/27/22 120/80  02/21/22 120/80    Physical Exam Vitals and nursing note reviewed.  HENT:     Head: Normocephalic.     Right Ear: Tympanic membrane and external ear normal.     Left Ear: Tympanic membrane and external ear normal.     Nose: Nose normal.  Eyes:     General: No scleral icterus.       Right eye: No discharge.        Left eye: No discharge.     Conjunctiva/sclera: Conjunctivae normal.     Pupils: Pupils are equal, round, and reactive to light.  Neck:     Thyroid: No thyromegaly.     Vascular: No JVD.     Trachea: No tracheal deviation.  Cardiovascular:     Rate and Rhythm: Normal rate and regular rhythm.     Heart sounds: Normal heart sounds. No murmur heard.    No friction rub. No gallop.  Pulmonary:     Effort: No respiratory distress.     Breath sounds: Normal breath sounds. No wheezing or rales.  Abdominal:     General: Bowel sounds are normal.     Palpations: Abdomen is soft. There is no mass.     Tenderness: There is no abdominal tenderness. There is no guarding or rebound.  Musculoskeletal:        General: No tenderness. Normal range of motion.     Cervical back: Normal range of motion and neck supple.  Lymphadenopathy:     Cervical: No cervical adenopathy.  Skin:    General: Skin is warm.      Findings: No rash.  Neurological:     Mental Status: He is alert and oriented to person, place, and time.     Cranial Nerves: No cranial nerve deficit.     Deep Tendon Reflexes: Reflexes are normal and symmetric.     Wt Readings from Last 3 Encounters:  03/22/22 222 lb (100.7 kg)  02/27/22 229 lb (103.9 kg)  02/21/22 229 lb (103.9 kg)    BP 138/80   Pulse 80   Ht 5\' 10"  (1.778  m)   Wt 222 lb (100.7 kg)   BMI 31.85 kg/m   Assessment and Plan:  1. Recurrent fever Patient experiencing recurrence of low-grade fevers over the past several months.  There has been no unexpected weight loss, adenopathy, easy bruisability, nor focal symptomatology other than a discomfort in the left upper quadrant.  We will screen with hep C antibody, HIV antibody, CBC with differential, hepatic function panel.  Lyme disease.  And as noted above ultrasound of the abdomen including the spleen. - Hepatitis C antibody - HIV Antibody (routine testing w rflx) - CBC with Differential/Platelet - Hepatic Function Panel (6) - Lyme Disease Serology w/Reflex - US Abdomen Complete; Future  2. Abdominal pain, LUQ Relatively new onset over the course of 2 months.  With the discomfort and a palpable sensation in the left upper quadrant.  We will obtain ultrasound of the abdomen that would include the left upper quadrant. - US Abdomen Complete; Future  3. Mixed hyperlipidemia Chronic.  Uncontrolled.  Patient has elevated LDL and triglycerides and we will check lipid panel for current status even though he is mildly not fasting/had fried onion rings for lunch. - Lipid Panel With LDL/HDL Ratio

## 2022-03-25 LAB — CBC WITH DIFFERENTIAL/PLATELET
Basophils Absolute: 0.1 10*3/uL (ref 0.0–0.2)
Basos: 1 %
EOS (ABSOLUTE): 0.1 10*3/uL (ref 0.0–0.4)
Eos: 1 %
Hematocrit: 40.5 % (ref 37.5–51.0)
Hemoglobin: 13.5 g/dL (ref 13.0–17.7)
Immature Grans (Abs): 0 10*3/uL (ref 0.0–0.1)
Immature Granulocytes: 0 %
Lymphocytes Absolute: 3.5 10*3/uL — ABNORMAL HIGH (ref 0.7–3.1)
Lymphs: 61 %
MCH: 28.2 pg (ref 26.6–33.0)
MCHC: 33.3 g/dL (ref 31.5–35.7)
MCV: 85 fL (ref 79–97)
Monocytes Absolute: 0.4 10*3/uL (ref 0.1–0.9)
Monocytes: 7 %
Neutrophils Absolute: 1.8 10*3/uL (ref 1.4–7.0)
Neutrophils: 30 %
Platelets: 205 10*3/uL (ref 150–450)
RBC: 4.79 x10E6/uL (ref 4.14–5.80)
RDW: 13.6 % (ref 11.6–15.4)
WBC: 5.8 10*3/uL (ref 3.4–10.8)

## 2022-03-25 LAB — LIPID PANEL WITH LDL/HDL RATIO
Cholesterol, Total: 121 mg/dL (ref 100–199)
HDL: 25 mg/dL — ABNORMAL LOW (ref >39)
LDL Chol Calc (NIH): 67 mg/dL (ref 0–99)
LDL/HDL Ratio: 2.7 ratio (ref 0.0–3.6)
Triglycerides: 165 mg/dL — ABNORMAL HIGH (ref 0–149)
VLDL Cholesterol Cal: 29 mg/dL (ref 5–40)

## 2022-03-25 LAB — HEPATIC FUNCTION PANEL (6)
ALT: 54 IU/L — ABNORMAL HIGH (ref 0–44)
AST: 29 IU/L (ref 0–40)
Albumin: 4.7 g/dL (ref 4.1–5.2)
Alkaline Phosphatase: 125 IU/L — ABNORMAL HIGH (ref 44–121)
Bilirubin Total: 0.6 mg/dL (ref 0.0–1.2)
Bilirubin, Direct: 0.18 mg/dL (ref 0.00–0.40)

## 2022-03-25 LAB — LYME DISEASE SEROLOGY W/REFLEX: Lyme Total Antibody EIA: NEGATIVE

## 2022-03-25 LAB — HEPATITIS C ANTIBODY: Hep C Virus Ab: NONREACTIVE

## 2022-03-25 LAB — HIV ANTIBODY (ROUTINE TESTING W REFLEX): HIV Screen 4th Generation wRfx: NONREACTIVE

## 2022-03-26 ENCOUNTER — Telehealth: Payer: Self-pay | Admitting: *Deleted

## 2022-03-28 ENCOUNTER — Ambulatory Visit
Admission: RE | Admit: 2022-03-28 | Discharge: 2022-03-28 | Disposition: A | Discharge status: Home / Self Care | Payer: BC Managed Care – PPO | Source: Ambulatory Visit | Attending: Family Medicine | Admitting: Family Medicine

## 2022-03-28 DIAGNOSIS — R1012 Left upper quadrant pain: Secondary | ICD-10-CM | POA: Diagnosis not present

## 2022-03-28 DIAGNOSIS — A689 Relapsing fever, unspecified: Secondary | ICD-10-CM | POA: Diagnosis not present

## 2022-03-28 DIAGNOSIS — R161 Splenomegaly, not elsewhere classified: Secondary | ICD-10-CM | POA: Diagnosis not present

## 2022-04-22 DIAGNOSIS — G4733 Obstructive sleep apnea (adult) (pediatric): Secondary | ICD-10-CM | POA: Diagnosis not present

## 2022-04-22 DIAGNOSIS — J358 Other chronic diseases of tonsils and adenoids: Secondary | ICD-10-CM | POA: Diagnosis not present

## 2022-05-10 DIAGNOSIS — G4733 Obstructive sleep apnea (adult) (pediatric): Secondary | ICD-10-CM | POA: Diagnosis not present

## 2022-05-17 ENCOUNTER — Encounter: Payer: Self-pay | Admitting: Otolaryngology

## 2023-03-04 ENCOUNTER — Telehealth: Payer: Self-pay | Admitting: Family Medicine

## 2023-03-04 NOTE — Telephone Encounter (Signed)
Copied from CRM [id number]. Topic: General - Other >> Mar 04, 2023  2:47 PM Turkey B wrote: Reason for CRM: pt called in about seeing if can get an appt today that he was offered with Dr Yetta Barre, but Dr Yetta Barre is out this afternoon. He says his face was getting better with the numbness and bp was 150/90 last night, He was offered appt tomorrow but said he didn't want to take off work, so he is going to urgent care just to get checked out.

## 2023-03-04 NOTE — Telephone Encounter (Signed)
Called patient, he said that he's feeling a little better and if things get worse he will go to urgent care.

## 2023-11-07 ENCOUNTER — Encounter: Payer: Self-pay | Admitting: Family Medicine

## 2023-11-07 ENCOUNTER — Ambulatory Visit: Payer: BC Managed Care – PPO | Admitting: Family Medicine

## 2023-11-07 VITALS — BP 118/80 | HR 85 | Ht 70.0 in | Wt 255.4 lb

## 2023-11-07 DIAGNOSIS — S93324A Dislocation of tarsometatarsal joint of right foot, initial encounter: Secondary | ICD-10-CM | POA: Diagnosis not present

## 2023-11-07 DIAGNOSIS — S161XXA Strain of muscle, fascia and tendon at neck level, initial encounter: Secondary | ICD-10-CM | POA: Insufficient documentation

## 2023-11-07 MED ORDER — METHOCARBAMOL 500 MG PO TABS: 500.0000 mg | ORAL_TABLET | Freq: Three times a day (TID) | ORAL | 0 refills | Status: DC | PRN

## 2023-11-07 NOTE — Patient Instructions (Signed)
Patient Plan  Neck and Upper Back Care:  1. Exercises: Continue home exercises to strengthen and improve flexibility in your neck and shoulders.     2. Medication: Use Robaxin as needed for muscle spasms.  3. Monitoring: If symptoms worsen or do not improve, consider scheduling imaging tests.  Foot Care (Post-Surgical Lisfranc Fracture):  1. Exercises: Begin home exercises for ankle stabilization and strength using the [employer] foot and ankle conditioning program materials provided.  2. Monitoring: If foot symptoms worsen or do not improve, consider scheduling imaging tests.  General Advice:  - Keep track of any changes in symptoms and report them during your follow-up appointments. - Stay active and maintain your exercise routine to support recovery.

## 2023-11-07 NOTE — Progress Notes (Signed)
Primary Care / Sports Medicine Office Visit  Patient Information:  Patient ID: Randy Farrell, male DOB: May 19, 1996 Age: 28 y.o. MRN: [id number]   Randy Farrell is a pleasant 28 y.o. male presenting with the following:  Chief Complaint  Patient presents with   Neck Pain    Occasional neck pain with looking up and looking to the left and occasional numbness and weakness in bil hands. Patient took ibu which did not help. He also took a muscle relaxer which helped significantly.     Vitals:   11/07/23 0834  BP: 118/80  Pulse: 85  SpO2: 95%   Vitals:   11/07/23 0834  Weight: 255 lb 6.4 oz (115.8 kg)  Height: 5\' 10"  (1.778 m)   Body mass index is 36.65 kg/m.  No results found.   Independent interpretation of notes and tests performed by another provider:   None  Procedures performed:   None  Pertinent History, Exam, Impression, and Recommendations:   Problem List Items Addressed This Visit     Acute strain of neck muscle - Primary   History of Present Illness The patient presents with neck pain, transient numbness, and transient weakness in both hands. The symptoms began suddenly one day while driving to work during the late November 2024 timeframe, with no recollection of any unusual activity or sleeping position that might have triggered it. The patient noticed increasing difficulty in turning their head to the left. While at work, the patient experienced a numbness sensation in the lower two fingers of both hands and a noticeable difference in strength. The patient also reported pain in the forearm and upper arm, more pronounced on the left side. The symptoms lasted for about three days. The patient took ibuprofen for two days without significant relief.  Symptoms rapidly improved after taking a skeletal muscle relaxer unspecified.  Physical Exam MUSCULOSKELETAL: Brachioradialis reflexes 2+ bilaterally. Upper extremity strength preserved. Negative Tinel's sign  at right and left cubital tunnels.  Median, radial and ulnar nerve distribution intact bilaterally at hands. Left cervical spine torsion, symmetric and full. Left upper trapezius muscle spasm. Full neck flexion and extension with pain at cervical spine. Full neck extension with pain and tenderness at occiput. Negative Spurling's test bilaterally. Minimal left upper trapezius muscular spasm. NEUROLOGICAL: Sensory motor intact bilateral upper extremity. Negative tunnels at right and left cubital tunnel. Radial, median, and ulnar nerve sensory motor intact bilateral hands. Left cervical torsion is symmetric and full, however, does elicit left upper trapezius pain. Full painless neck flexion forward. Full painful neck extension with focal tenderness centrally. Full painless lateral bend bilaterally.  Assessment and Plan Cervical paraspinal muscular strain with transient radicular Symptoms Recent onset of neck pain with numbness and weakness in both hands, particularly the fourth and fifth fingers. Symptoms have improved significantly. Examination revealed mild left upper trapezius muscular spasm, but sensory and motor function in the upper extremities is intact. -Continue home exercises for neck and shoulder strengthening and flexibility. -Prescribe Robaxin as needed for muscle spasm. -Consider imaging if symptoms worsen or do not improve with conservative management.      Relevant Medications   methocarbamol (ROBAXIN) 500 MG tablet   Lisfranc's dislocation   In addition to the neck issues, the patient reported stiffness in the foot, particularly after periods of immobility. This stiffness tends to resolve with movement. The patient has a history of a right Lisfranc fracture, which was surgically repaired.   Post-surgical Lisfranc Fracture History of repaired  Lisfranc fracture in 2014 with current symptoms of stiffness and pain after prolonged use or immobility, suggestive of post-traumatic  arthritis. -Start home exercises for ankle stabilization and strength.  [employer] foot and ankle conditioning program materials provided. -Consider imaging if symptoms worsen or do not improve with conservative management.      I provided a total time of 40 minutes including both face-to-face and non-face-to-face time on 11/07/2023 inclusive of time utilized for medical chart review, information gathering, care coordination with staff, and documentation completion.   Orders & Medications Medications:  Meds ordered this encounter  Medications   methocarbamol (ROBAXIN) 500 MG tablet    Sig: Take 1 tablet (500 mg total) by mouth every 8 (eight) hours as needed for muscle spasms.    Dispense:  30 tablet    Refill:  0   No orders of the defined types were placed in this encounter.    Return for Patient preference next few months for est primary care with Jesusita Oka.     Jerrol Banana, MD, [facility]   Primary Care Sports Medicine Primary Care and Sports Medicine at [facility]

## 2023-11-07 NOTE — Assessment & Plan Note (Signed)
In addition to the neck issues, the patient reported stiffness in the foot, particularly after periods of immobility. This stiffness tends to resolve with movement. The patient has a history of a right Lisfranc fracture, which was surgically repaired.   Post-surgical Lisfranc Fracture History of repaired Lisfranc fracture in 2014 with current symptoms of stiffness and pain after prolonged use or immobility, suggestive of post-traumatic arthritis. -Start home exercises for ankle stabilization and strength.  [employer] foot and ankle conditioning program materials provided. -Consider imaging if symptoms worsen or do not improve with conservative management.

## 2023-11-07 NOTE — Assessment & Plan Note (Signed)
History of Present Illness The patient presents with neck pain, transient numbness, and transient weakness in both hands. The symptoms began suddenly one day while driving to work during the late November 2024 timeframe, with no recollection of any unusual activity or sleeping position that might have triggered it. The patient noticed increasing difficulty in turning their head to the left. While at work, the patient experienced a numbness sensation in the lower two fingers of both hands and a noticeable difference in strength. The patient also reported pain in the forearm and upper arm, more pronounced on the left side. The symptoms lasted for about three days. The patient took ibuprofen for two days without significant relief.  Symptoms rapidly improved after taking a skeletal muscle relaxer unspecified.  Physical Exam MUSCULOSKELETAL: Brachioradialis reflexes 2+ bilaterally. Upper extremity strength preserved. Negative Tinel's sign at right and left cubital tunnels.  Median, radial and ulnar nerve distribution intact bilaterally at hands. Left cervical spine torsion, symmetric and full. Left upper trapezius muscle spasm. Full neck flexion and extension with pain at cervical spine. Full neck extension with pain and tenderness at occiput. Negative Spurling's test bilaterally. Minimal left upper trapezius muscular spasm. NEUROLOGICAL: Sensory motor intact bilateral upper extremity. Negative tunnels at right and left cubital tunnel. Radial, median, and ulnar nerve sensory motor intact bilateral hands. Left cervical torsion is symmetric and full, however, does elicit left upper trapezius pain. Full painless neck flexion forward. Full painful neck extension with focal tenderness centrally. Full painless lateral bend bilaterally.  Assessment and Plan Cervical paraspinal muscular strain with transient radicular Symptoms Recent onset of neck pain with numbness and weakness in both hands, particularly the fourth  and fifth fingers. Symptoms have improved significantly. Examination revealed mild left upper trapezius muscular spasm, but sensory and motor function in the upper extremities is intact. -Continue home exercises for neck and shoulder strengthening and flexibility. -Prescribe Robaxin as needed for muscle spasm. -Consider imaging if symptoms worsen or do not improve with conservative management.

## 2023-12-14 IMAGING — US US ABDOMEN COMPLETE
1 series · 14 of 25 positions shown · non-contrast
Comparison: Abdominal ultrasound 07/21/2018

CLINICAL DATA: Abdominal pain

EXAM:
ABDOMEN ULTRASOUND COMPLETE

[Series 1: us abdomen complete · 0.27mm/px · 14 of 84 slices shown]
[im 1/84]
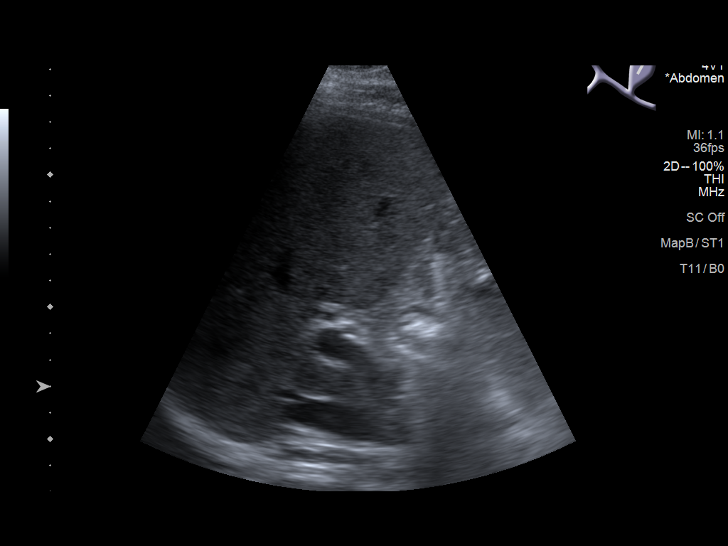
[im 7/84]
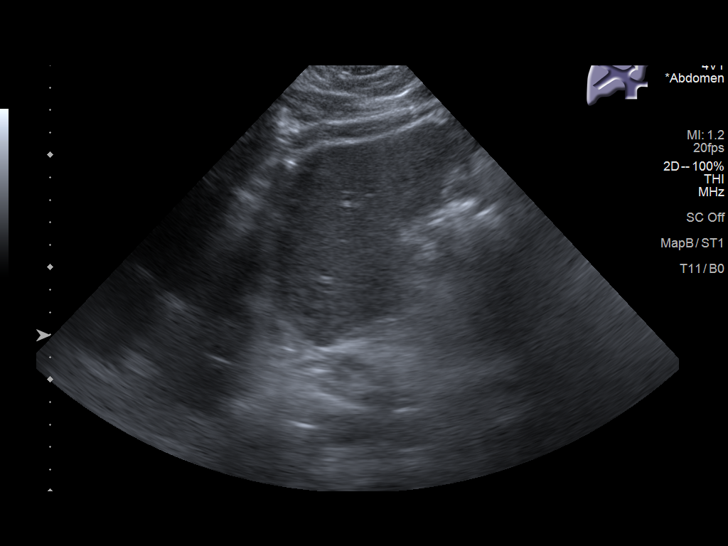
[im 14/84]
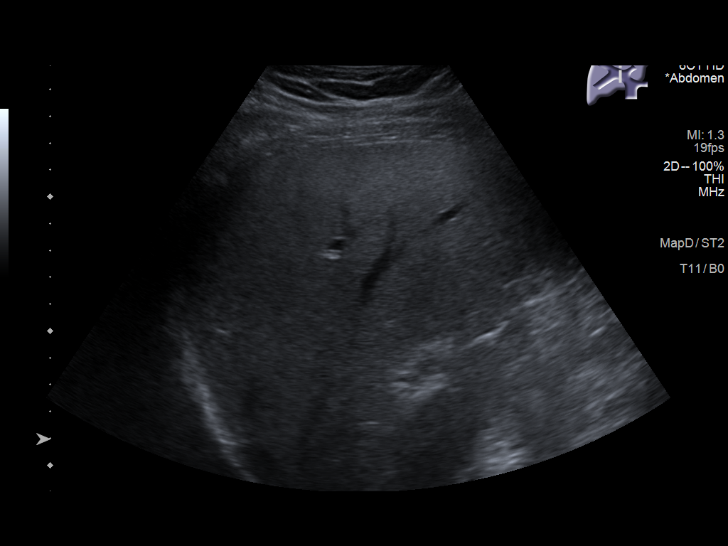
[im 21/84]
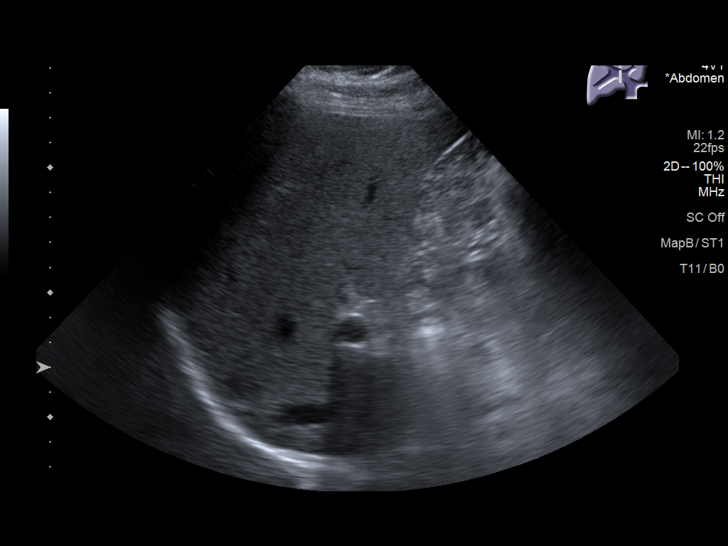
[im 28/84]
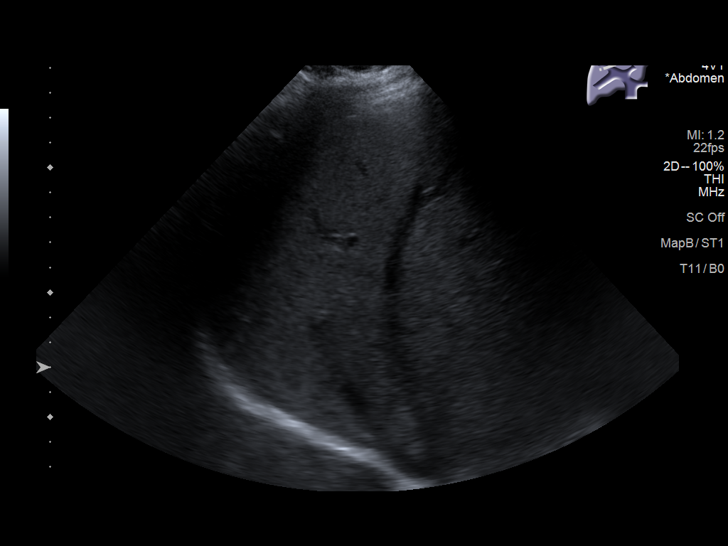
[im 32/84]
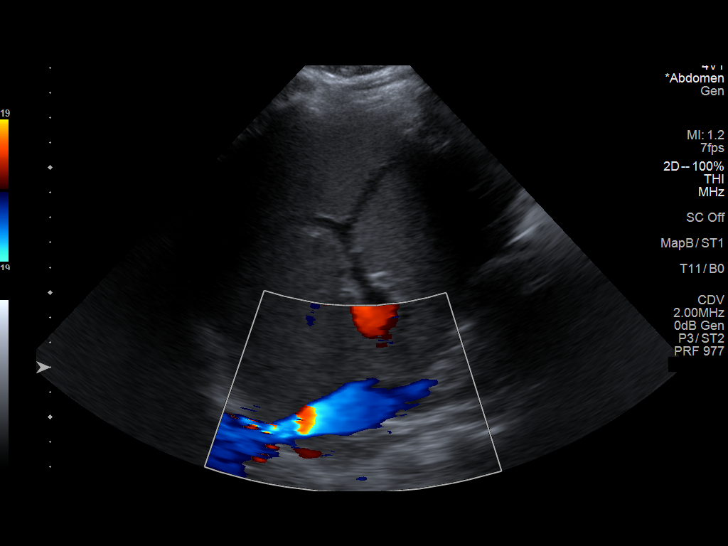
[im 39/84]
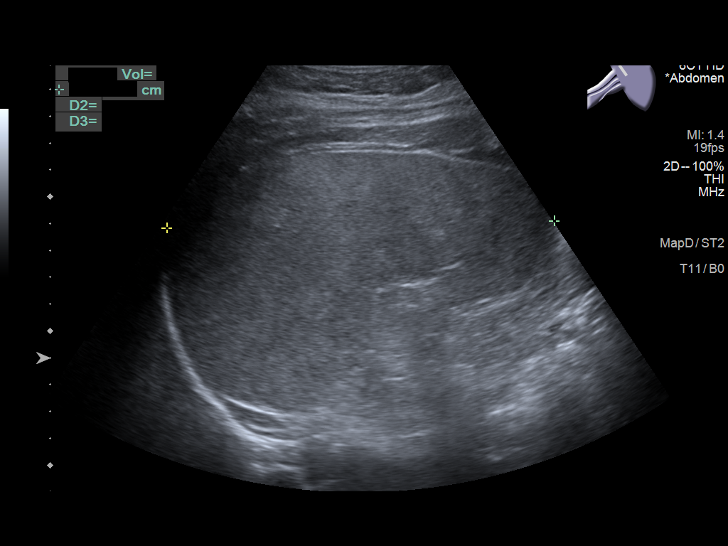
[im 45/84]
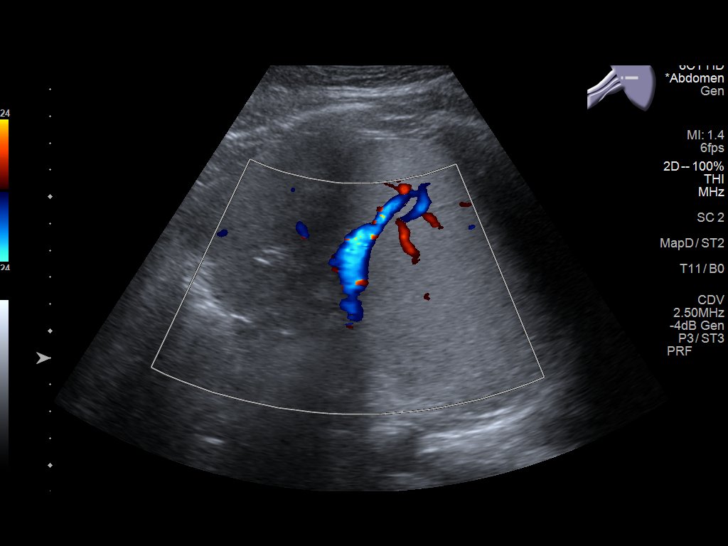
[im 52/84]
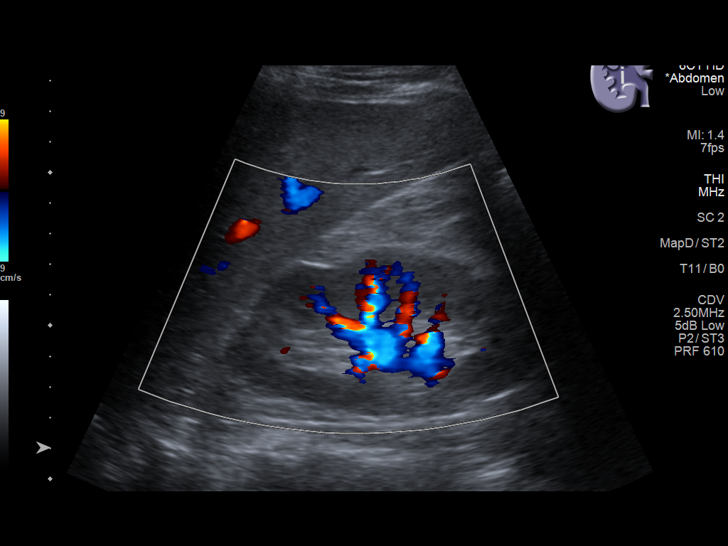
[im 56/84]
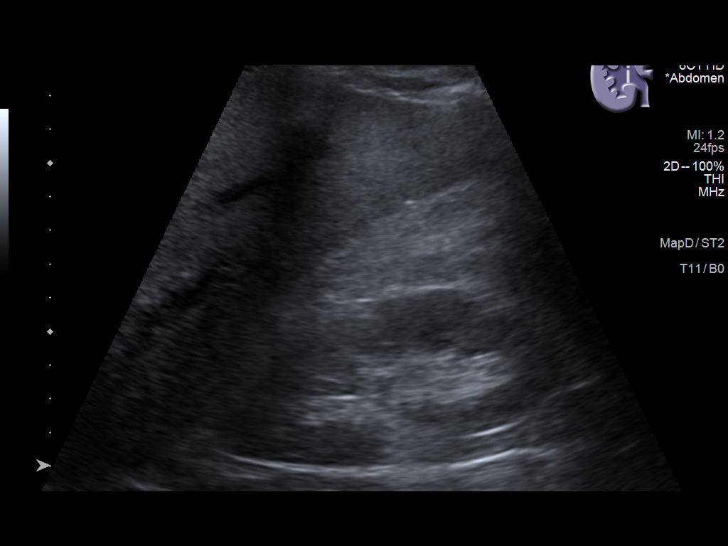
[im 63/84]
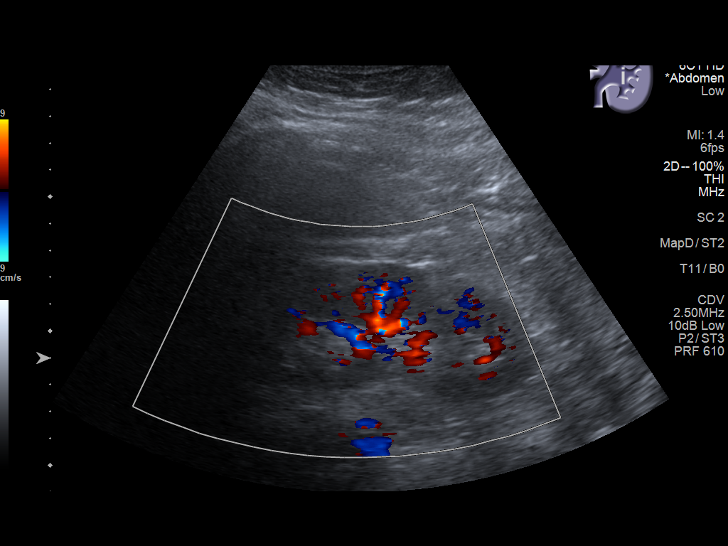
[im 70/84]
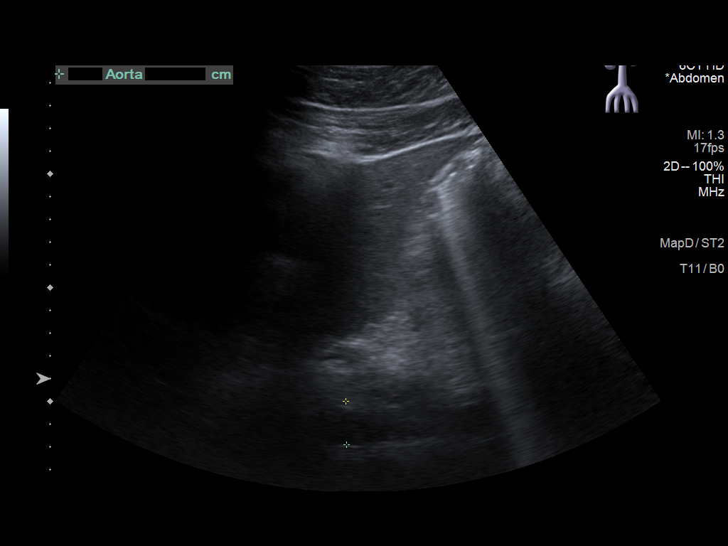
[im 77/84]
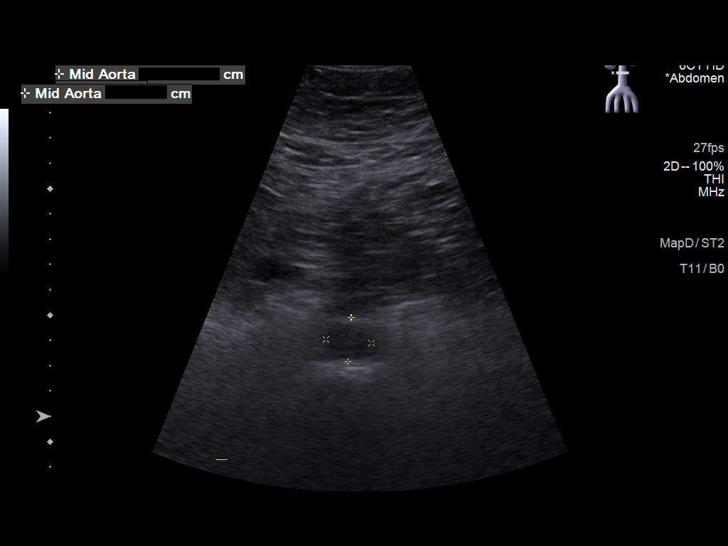
[im 84/84]
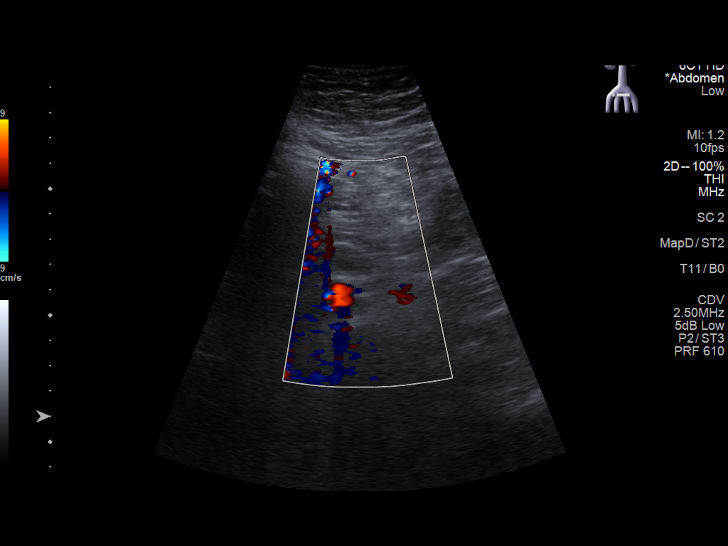

[14 of 25 positions shown; findings below may reference images not displayed]

FINDINGS: Gallbladder: Surgically absent.

Common bile duct: Diameter: 4 mm

Liver: Coarse, increased echogenicity of the parenchyma with no
focal mass identified. Portal vein is patent on color Doppler
imaging with normal direction of blood flow towards the liver.

IVC: No abnormality visualized.

Pancreas: Visualized portion unremarkable.

Spleen: Enlarged measuring 14.9 cm in length.

Right Kidney: Length: 11.2 cm. Echogenicity within normal limits. No
mass or hydronephrosis visualized.

Left Kidney: Length: 11.8 cm. Echogenicity within normal limits. No
mass or hydronephrosis visualized.

Abdominal aorta: No aneurysm visualized.

Other findings: None.
IMPRESSION: 1. Evidence of hepatic steatosis.
2. Splenomegaly.

## 2024-01-05 NOTE — Telephone Encounter (Signed)
 Please review. JM

## 2024-01-21 ENCOUNTER — Encounter: Payer: Self-pay | Admitting: Physician Assistant

## 2024-01-21 ENCOUNTER — Ambulatory Visit: Payer: Self-pay | Admitting: Physician Assistant

## 2024-01-21 VITALS — BP 124/88 | HR 85 | Temp 98.1°F | Ht 70.0 in | Wt 244.0 lb

## 2024-01-21 DIAGNOSIS — R7401 Elevation of levels of liver transaminase levels: Secondary | ICD-10-CM | POA: Diagnosis not present

## 2024-01-21 DIAGNOSIS — Z113 Encounter for screening for infections with a predominantly sexual mode of transmission: Secondary | ICD-10-CM

## 2024-01-21 DIAGNOSIS — Z7289 Other problems related to lifestyle: Secondary | ICD-10-CM | POA: Diagnosis not present

## 2024-01-21 DIAGNOSIS — Z23 Encounter for immunization: Secondary | ICD-10-CM

## 2024-01-21 DIAGNOSIS — Z0000 Encounter for general adult medical examination without abnormal findings: Secondary | ICD-10-CM

## 2024-01-21 NOTE — Progress Notes (Signed)
 Date:  01/21/2024   Name:  Randy Farrell   DOB:  1996/01/22   MRN:  [id number]   Chief Complaint: Annual Exam (STD/HIV testing, interested in taking prep to prevent HIV)  HPI Randy Farrell is a very pleasant 28 year old male who presents to the clinic today to establish care and complete a routine physical.  He would also like to complete asymptomatic STI screening and discuss PrEP for HPV. Last intercourse 3 weeks ago, has male partners, no stable relationship at present. No needle use or blood transfusions.  Previous STI screenings have been negative.  Denies penile lesions, genital rash, discharge.  Last Physical: 2023 Last Dental Exam: 37m ago Last Eye Exam: 3y ago Immunizations Due: HPV (option)    Medication list has been reviewed and updated.  No outpatient medications have been marked as taking for the 01/21/24 encounter (Office Visit) with Remo Lipps, PA.     Review of Systems  Patient Active Problem List   Diagnosis Date Noted   Gastroesophageal reflux disease    History of Lisfranc's dislocation 08/04/2013    Allergies  Allergen Reactions   Amoxicillin Rash    Immunization History  Administered Date(s) Administered   DTP 07/18/1995, 09/15/1995, 11/12/1995   DTaP / HiB 07/18/1995, 09/15/1995, 11/12/1995   HPV 9-valent 01/21/2024   Hepatitis B, ADULT Sep 15, 1995, 06/18/1995, 11/12/1995   Influenza,inj,Quad PF,6+ Mos 06/23/2018   OPV 07/18/1995, 09/15/1995   PFIZER(Purple Top)SARS-COV-2 Vaccination 01/06/2020, 01/27/2020   Tdap 06/14/2019    Past Surgical History:  Procedure Laterality Date   ESOPHAGOGASTRODUODENOSCOPY (EGD) WITH PROPOFOL N/A 11/07/2017   Procedure: ESOPHAGOGASTRODUODENOSCOPY (EGD) WITH PROPOFOL;  Surgeon: Midge Minium, MD;  Location: [facility];  Service: Endoscopy;  Laterality: N/A;   FOOT FRACTURE SURGERY Right 05/2013   FRACTURE SURGERY  Lisfranc Right Foot   2014   HERNIA REPAIR  Lower abdomen   as infant   ROBOTIC  ASSISTED LAPAROSCOPIC CHOLECYSTECTOMY-MULTI SITE N/A 07/30/2018   Procedure: ROBOTIC ASSISTED LAPAROSCOPIC CHOLECYSTECTOMY-MULTI SITE;  Surgeon: Leafy Ro, MD;  Location: [facility];  Service: General;  Laterality: N/A;    Social History   Tobacco Use   Smoking status: Never    Passive exposure: Yes   Smokeless tobacco: Never  Vaping Use   Vaping status: Never Used  Substance Use Topics   Alcohol use: Yes    Alcohol/week: 0.0 standard drinks of alcohol    Comment: social   Drug use: No    Family History  Problem Relation Age of Onset   Healthy Mother    Healthy Father    Brain cancer Maternal Grandmother    Throat cancer Paternal Grandmother    Lung cancer Paternal Grandmother         11/07/2023    8:41 AM 03/22/2022    2:36 PM 02/21/2022    2:48 PM 11/02/2021   11:07 AM  GAD 7 : Generalized Anxiety Score  Nervous, Anxious, on Edge 0 0 1 0  Control/stop worrying 0 0 1 0  Worry too much - different things 0 1 2 0  Trouble relaxing  0 0 0  Restless 0 0 0 0  Easily annoyed or irritable 0 1 2 0  Afraid - awful might happen 0 1 1 0  Total GAD 7 Score  3 7 0  Anxiety Difficulty Not difficult at all Not difficult at all Very difficult Not difficult at all       11/07/2023    8:40 AM 03/22/2022  2:36 PM 02/21/2022    2:48 PM  Depression screen PHQ 2/9  Decreased Interest 0 1 2  Down, Depressed, Hopeless 0 1 0  PHQ - 2 Score 0 2 2  Altered sleeping 1 1 3   Tired, decreased energy 0 1 2  Change in appetite 1 2 1   Feeling bad or failure about yourself  0 1 1  Trouble concentrating 0 0 1  Moving slowly or fidgety/restless 0 0 1  Suicidal thoughts 0 0 0  PHQ-9 Score 2 7 11   Difficult doing work/chores Not difficult at all Not difficult at all Extremely dIfficult    BP Readings from Last 3 Encounters:  01/21/24 124/88  11/07/23 118/80  03/22/22 138/80    Wt Readings from Last 3 Encounters:  01/21/24 244 lb (110.7 kg)  11/07/23 255 lb 6.4 oz (115.8 kg)  03/22/22  222 lb (100.7 kg)    BP 124/88   Pulse 85   Temp 98.1 F (36.7 C)   Ht 5\' 10"  (1.778 m)   Wt 244 lb (110.7 kg)   SpO2 95%   BMI 35.01 kg/m   Physical Exam Vitals and nursing note reviewed.  Constitutional:      Appearance: Normal appearance.  HENT:     Ears:     Comments: EAC clear bilaterally with good view of TM which is without effusion or erythema.     Nose: Nose normal.     Mouth/Throat:     Mouth: Mucous membranes are moist. No oral lesions.     Dentition: Normal dentition.     Pharynx: No posterior oropharyngeal erythema.  Eyes:     Extraocular Movements: Extraocular movements intact.     Conjunctiva/sclera: Conjunctivae normal.     Pupils: Pupils are equal, round, and reactive to light.  Neck:     Thyroid: No thyromegaly.  Cardiovascular:     Rate and Rhythm: Normal rate and regular rhythm.     Heart sounds: No murmur heard.    No friction rub. No gallop.     Comments: Pulses 2+ at radial, PT, DP bilaterally. No carotid bruit. No peripheral edema Pulmonary:     Effort: Pulmonary effort is normal.     Breath sounds: Normal breath sounds.  Abdominal:     General: Bowel sounds are normal.     Palpations: Abdomen is soft. There is no mass.     Tenderness: There is no abdominal tenderness.  Genitourinary:    Comments: Genital/rectal exam deferred. Reviewed technique for testicular self-exam. Musculoskeletal:     Comments: Full ROM with strength 5/5 bilateral upper and lower extremities  Lymphadenopathy:     Cervical: No cervical adenopathy.  Skin:    General: Skin is warm.     Capillary Refill: Capillary refill takes less than 2 seconds.     Findings: No lesion or rash.  Neurological:     Mental Status: He is alert and oriented to person, place, and time.     Gait: Gait is intact.  Psychiatric:        Mood and Affect: Mood normal.        Behavior: Behavior normal.     Recent Labs     Component Value Date/Time   NA 143 11/30/2020 1354   K 4.1  11/30/2020 1354   CL 103 11/30/2020 1354   CO2 21 11/30/2020 1354   GLUCOSE 81 11/30/2020 1354   GLUCOSE 101 (H) 07/20/2018 1219   BUN 10 11/30/2020 1354   CREATININE 0.87  11/30/2020 1354   CALCIUM 9.5 11/30/2020 1354   PROT 7.7 07/20/2018 1219   ALBUMIN 4.7 03/22/2022 1520   AST 29 03/22/2022 1520   ALT 54 (H) 03/22/2022 1520   ALKPHOS 125 (H) 03/22/2022 1520   BILITOT 0.6 03/22/2022 1520   GFRNONAA 120 11/30/2020 1354   GFRAA 139 11/30/2020 1354    Lab Results  Component Value Date   WBC 5.8 03/22/2022   HGB 13.5 03/22/2022   HCT 40.5 03/22/2022   MCV 85 03/22/2022   PLT 205 03/22/2022   No results found for: "HGBA1C" Lab Results  Component Value Date   CHOL 121 03/22/2022   HDL 25 (L) 03/22/2022   LDLCALC 67 03/22/2022   TRIG 165 (H) 03/22/2022   No results found for: "TSH"   Assessment and Plan:  1. Annual physical exam (Primary) Seemingly healthy patient with no abnormalities on exam. Encouraged healthy lifestyle including regular physical activity and consumption of whole fruits and vegetables. Encouraged routine dental and eye exams. Vaccinations up to date.   - CBC with Differential/Platelet - Comprehensive metabolic panel with GFR - TSH  2. Screening examination for STI Checking STI panel as below. Will ensure negative HIV before considering PrEP. Patient aware that PrEP would require routine monitoring.   - GC/Chlamydia Probe Amp - RPR - HIV Antibody (routine testing w rflx) - Hepatitis C antibody - Hepatitis B surface antibody,qualitative - Hepatitis B surface antigen - Hepatitis B core antibody, total  3. Immunization due Despite age, through shared decision-making we will proceed with Gardasil immunization, with dose #1 administered today. Return in 58mo and 75mo to complete the immunization schedule.   - HPV 9-valent vaccine,Recombinat    Return in about 1 year (around 01/20/2025) for CPE.  Return in 2 mo nurse visit Gardasil #2 Return sooner  TBD pending decisions on PrEP   Alvester Morin, PA-C, DMSc, Nutritionist [facility] [phone]

## 2024-01-22 ENCOUNTER — Other Ambulatory Visit: Payer: Self-pay | Admitting: Physician Assistant

## 2024-01-22 DIAGNOSIS — Z79899 Other long term (current) drug therapy: Secondary | ICD-10-CM | POA: Insufficient documentation

## 2024-01-22 LAB — CBC WITH DIFFERENTIAL/PLATELET
Basophils Absolute: 0.1 10*3/uL (ref 0.0–0.2)
Basos: 1 %
EOS (ABSOLUTE): 0.1 10*3/uL (ref 0.0–0.4)
Eos: 2 %
Hematocrit: 46.8 % (ref 37.5–51.0)
Hemoglobin: 15.6 g/dL (ref 13.0–17.7)
Immature Grans (Abs): 0 10*3/uL (ref 0.0–0.1)
Immature Granulocytes: 0 %
Lymphocytes Absolute: 2.3 10*3/uL (ref 0.7–3.1)
Lymphs: 38 %
MCH: 28.8 pg (ref 26.6–33.0)
MCHC: 33.3 g/dL (ref 31.5–35.7)
MCV: 87 fL (ref 79–97)
Monocytes Absolute: 0.5 10*3/uL (ref 0.1–0.9)
Monocytes: 8 %
Neutrophils Absolute: 3 10*3/uL (ref 1.4–7.0)
Neutrophils: 51 %
Platelets: 220 10*3/uL (ref 150–450)
RBC: 5.41 x10E6/uL (ref 4.14–5.80)
RDW: 12.8 % (ref 11.6–15.4)
WBC: 6 10*3/uL (ref 3.4–10.8)

## 2024-01-22 LAB — COMPREHENSIVE METABOLIC PANEL WITH GFR
ALT: 45 IU/L — ABNORMAL HIGH (ref 0–44)
AST: 27 IU/L (ref 0–40)
Albumin: 4.7 g/dL (ref 4.3–5.2)
Alkaline Phosphatase: 109 IU/L (ref 44–121)
BUN/Creatinine Ratio: 11 (ref 9–20)
BUN: 10 mg/dL (ref 6–20)
Bilirubin Total: 0.5 mg/dL (ref 0.0–1.2)
CO2: 23 mmol/L (ref 20–29)
Calcium: 9.6 mg/dL (ref 8.7–10.2)
Chloride: 103 mmol/L (ref 96–106)
Creatinine, Ser: 0.88 mg/dL (ref 0.76–1.27)
Globulin, Total: 2.1 g/dL (ref 1.5–4.5)
Glucose: 97 mg/dL (ref 70–99)
Potassium: 4.3 mmol/L (ref 3.5–5.2)
Sodium: 141 mmol/L (ref 134–144)
Total Protein: 6.8 g/dL (ref 6.0–8.5)
eGFR: 120 mL/min/{1.73_m2} (ref >59)

## 2024-01-22 LAB — TSH: TSH: 1.97 u[IU]/mL (ref 0.450–4.500)

## 2024-01-22 LAB — RPR: RPR Ser Ql: NONREACTIVE

## 2024-01-22 LAB — HEPATITIS B SURFACE ANTIGEN: Hepatitis B Surface Ag: NEGATIVE

## 2024-01-22 LAB — HIV ANTIBODY (ROUTINE TESTING W REFLEX): HIV Screen 4th Generation wRfx: NONREACTIVE

## 2024-01-22 LAB — HEPATITIS C ANTIBODY: Hep C Virus Ab: NONREACTIVE

## 2024-01-22 LAB — HEPATITIS B CORE ANTIBODY, TOTAL: Hep B Core Total Ab: NEGATIVE

## 2024-01-22 LAB — HEPATITIS B SURFACE ANTIBODY,QUALITATIVE: Hep B Surface Ab, Qual: NONREACTIVE

## 2024-01-22 MED ORDER — EMTRICITABINE-TENOFOVIR DF 200-300 MG PO TABS: 1.0000 | ORAL_TABLET | Freq: Every day | ORAL | 1 refills | Status: DC

## 2024-01-22 NOTE — Telephone Encounter (Signed)
 Please review.  KP

## 2024-01-22 NOTE — Telephone Encounter (Signed)
 Please call pt to schedule 2 appts.  KP

## 2024-01-23 LAB — SPECIMEN STATUS REPORT

## 2024-01-23 LAB — LIPID PANEL W/O CHOL/HDL RATIO
Cholesterol, Total: 168 mg/dL (ref 100–199)
HDL: 33 mg/dL — ABNORMAL LOW (ref >39)
LDL Chol Calc (NIH): 105 mg/dL — ABNORMAL HIGH (ref 0–99)
Triglycerides: 167 mg/dL — ABNORMAL HIGH (ref 0–149)
VLDL Cholesterol Cal: 30 mg/dL (ref 5–40)

## 2024-01-24 LAB — GC/CHLAMYDIA PROBE AMP
Chlamydia trachomatis, NAA: NEGATIVE
Neisseria Gonorrhoeae by PCR: NEGATIVE

## 2024-02-24 ENCOUNTER — Ambulatory Visit

## 2024-03-01 ENCOUNTER — Ambulatory Visit (INDEPENDENT_AMBULATORY_CARE_PROVIDER_SITE_OTHER)

## 2024-03-01 DIAGNOSIS — Z23 Encounter for immunization: Secondary | ICD-10-CM | POA: Diagnosis not present

## 2024-03-29 ENCOUNTER — Ambulatory Visit (INDEPENDENT_AMBULATORY_CARE_PROVIDER_SITE_OTHER)

## 2024-03-29 DIAGNOSIS — Z23 Encounter for immunization: Secondary | ICD-10-CM

## 2024-04-02 ENCOUNTER — Ambulatory Visit: Payer: Self-pay | Admitting: Physician Assistant

## 2024-04-22 ENCOUNTER — Telehealth: Payer: Self-pay

## 2024-04-22 ENCOUNTER — Telehealth: Admitting: Physician Assistant

## 2024-04-22 ENCOUNTER — Other Ambulatory Visit (HOSPITAL_COMMUNITY): Payer: Self-pay

## 2024-04-22 ENCOUNTER — Telehealth: Payer: Self-pay | Admitting: Pharmacy Technician

## 2024-04-22 ENCOUNTER — Other Ambulatory Visit: Payer: Self-pay

## 2024-04-22 DIAGNOSIS — Z79899 Other long term (current) drug therapy: Secondary | ICD-10-CM

## 2024-04-22 MED ORDER — EMTRICITABINE-TENOFOVIR DF 200-300 MG PO TABS: 1.0000 | ORAL_TABLET | Freq: Every day | ORAL | 0 refills | Status: DC

## 2024-04-22 NOTE — Telephone Encounter (Signed)
 Please complete PA for truvavda.  KP

## 2024-04-22 NOTE — Telephone Encounter (Signed)
 Good afternoon, Truvada was filled today it's coming back refill too soon

## 2024-04-22 NOTE — Telephone Encounter (Signed)
 Pharmacy Patient Advocate Encounter   Received notification from Pt Calls Messages that prior authorization for TRUVADA is required/requested.   Insurance verification completed.   The patient is insured through [employer] .   Per test claim: Refill too soon. PA is not needed at this time. Medication was filled 04/22/24. Next eligible fill date is 05/14/24.

## 2024-04-22 NOTE — Telephone Encounter (Signed)
 Noted  KP

## 2024-08-30 ENCOUNTER — Ambulatory Visit (INDEPENDENT_AMBULATORY_CARE_PROVIDER_SITE_OTHER)

## 2024-08-30 ENCOUNTER — Ambulatory Visit: Admitting: Physician Assistant

## 2024-08-30 ENCOUNTER — Encounter: Payer: Self-pay | Admitting: Physician Assistant

## 2024-08-30 VITALS — BP 124/86 | HR 85 | Temp 97.8°F | Ht 70.0 in | Wt 260.0 lb

## 2024-08-30 VITALS — BP 124/86 | HR 85 | Ht 70.0 in | Wt 260.0 lb

## 2024-08-30 DIAGNOSIS — Z23 Encounter for immunization: Secondary | ICD-10-CM

## 2024-08-30 DIAGNOSIS — Z113 Encounter for screening for infections with a predominantly sexual mode of transmission: Secondary | ICD-10-CM | POA: Diagnosis not present

## 2024-08-30 DIAGNOSIS — Z79899 Other long term (current) drug therapy: Secondary | ICD-10-CM

## 2024-08-30 MED ORDER — EMTRICITABINE-TENOFOVIR DF 200-300 MG PO TABS: 1.0000 | ORAL_TABLET | Freq: Every day | ORAL | 1 refills | Status: AC

## 2024-08-30 NOTE — Assessment & Plan Note (Signed)
 Refill Truvada. Reviewed STI screen q40m with annual routine labs. Encouraged safe sex practices including condom use, as Truvada only protects against HIV and is not meant to prevent other diseases.

## 2024-08-30 NOTE — Progress Notes (Signed)
 Date:  08/30/2024   Name:  Randy Farrell   DOB:  [birth year]   MRN:  [id number]   Chief Complaint: Medical Management of Chronic Issues (Truvada  f/u)  HPI  Randy Farrell presents today a little overdue for f/u after starting Truvada in July. He has done well with this, reporting good compliance and tolerance. He has not been sexually active in the last 1 month due to uncertainty regarding medication refill. He does not have one particular partner and he does not often use condoms. STI screening in Apr was all clear, though Hep B immunity was inadequate so he received 2-dose Heplisav booster and also completed his Gardasil series (3rd dose today).   It should be noted that he has gained about 16 lb since our last visit.   Medication list has been reviewed and updated.  Current Meds  Medication Sig   emtricitabine -tenofovir  (TRUVADA) 200-300 MG tablet Take 1 tablet by mouth daily.     Review of Systems  Patient Active Problem List   Diagnosis Date Noted   On pre-exposure prophylaxis for HIV 01/22/2024   Gastroesophageal reflux disease    History of Lisfranc's dislocation 08/04/2013    Allergies  Allergen Reactions   Amoxicillin  Rash    Immunization History  Administered Date(s) Administered   DTP 07/18/1995, 09/15/1995, 11/12/1995   DTaP / HiB 07/18/1995, 09/15/1995, 11/12/1995   HPV 9-valent 01/21/2024, 03/29/2024, 08/30/2024   Hepatitis B 03/01/2024   Hepatitis B, ADULT 1995/02/14, 06/18/1995, 11/12/1995   Hepb-cpg 03/29/2024   Influenza,inj,Quad PF,6+ Mos 06/23/2018   OPV 07/18/1995, 09/15/1995   PFIZER(Purple Top)SARS-COV-2 Vaccination 01/06/2020, 01/27/2020   Tdap 06/14/2019    Past Surgical History:  Procedure Laterality Date   ESOPHAGOGASTRODUODENOSCOPY (EGD) WITH PROPOFOL  N/A 11/07/2017   Procedure: ESOPHAGOGASTRODUODENOSCOPY (EGD) WITH PROPOFOL ;  Surgeon: Jinny Carmine, MD;  Location: [facility];  Service: Endoscopy;  Laterality: N/A;   FOOT  FRACTURE SURGERY Right 05/2013   FRACTURE SURGERY  Lisfranc Right Foot   2014   HERNIA REPAIR  Lower abdomen   as infant   ROBOTIC ASSISTED LAPAROSCOPIC CHOLECYSTECTOMY-MULTI SITE N/A 07/30/2018   Procedure: ROBOTIC ASSISTED LAPAROSCOPIC CHOLECYSTECTOMY-MULTI SITE;  Surgeon: Jordis Laneta FALCON, MD;  Location: [facility];  Service: General;  Laterality: N/A;    Social History   Tobacco Use   Smoking status: Never    Passive exposure: Yes   Smokeless tobacco: Never  Vaping Use   Vaping status: Never Used  Substance Use Topics   Alcohol use: Yes    Alcohol/week: 0.0 standard drinks of alcohol    Comment: social   Drug use: No    Family History  Problem Relation Age of Onset   Healthy Mother    Healthy Father    Brain cancer Maternal Grandmother    Throat cancer Paternal Grandmother    Lung cancer Paternal Grandmother         08/30/2024    2:58 PM 11/07/2023    8:41 AM 03/22/2022    2:36 PM 02/21/2022    2:48 PM  GAD 7 : Generalized Anxiety Score  Nervous, Anxious, on Edge 0 0 0 1  Control/stop worrying 0 0 0 1  Worry too much - different things 0 0 1 2  Trouble relaxing 0  0 0  Restless 0 0 0 0  Easily annoyed or irritable 0 0 1 2  Afraid - awful might happen 0 0 1 1  Total GAD 7 Score 0  3 7  Anxiety Difficulty  Not difficult at all Not difficult at all Not difficult at all Very difficult       08/30/2024    2:57 PM 11/07/2023    8:40 AM 03/22/2022    2:36 PM  Depression screen PHQ 2/9  Decreased Interest 0 0 1  Down, Depressed, Hopeless 0 0 1  PHQ - 2 Score 0 0 2  Altered sleeping  1 1  Tired, decreased energy  0 1  Change in appetite  1 2  Feeling bad or failure about yourself   0 1  Trouble concentrating  0 0  Moving slowly or fidgety/restless  0 0  Suicidal thoughts  0 0  PHQ-9 Score  2  7   Difficult doing work/chores  Not difficult at all Not difficult at all     Data saved with a previous flowsheet row definition    BP Readings from Last 3 Encounters:   08/30/24 124/86  08/30/24 124/86  01/21/24 124/88    Wt Readings from Last 3 Encounters:  08/30/24 260 lb (117.9 kg)  08/30/24 260 lb (117.9 kg)  01/21/24 244 lb (110.7 kg)    BP 124/86   Pulse 85   Ht 5' 10 (1.778 m)   Wt 260 lb (117.9 kg)   SpO2 95%   BMI 37.31 kg/m   Physical Exam Vitals and nursing note reviewed.  Constitutional:      Appearance: Normal appearance.  Cardiovascular:     Rate and Rhythm: Normal rate.  Pulmonary:     Effort: Pulmonary effort is normal.  Abdominal:     General: There is no distension.  Musculoskeletal:        General: Normal range of motion.  Skin:    General: Skin is warm and dry.  Neurological:     Mental Status: He is alert and oriented to person, place, and time.     Gait: Gait is intact.  Psychiatric:        Mood and Affect: Mood and affect normal.     Recent Labs     Component Value Date/Time   NA 141 01/21/2024 1029   K 4.3 01/21/2024 1029   CL 103 01/21/2024 1029   CO2 23 01/21/2024 1029   GLUCOSE 97 01/21/2024 1029   GLUCOSE 101 (H) 07/20/2018 1219   BUN 10 01/21/2024 1029   CREATININE 0.88 01/21/2024 1029   CALCIUM 9.6 01/21/2024 1029   PROT 6.8 01/21/2024 1029   ALBUMIN 4.7 01/21/2024 1029   AST 27 01/21/2024 1029   ALT 45 (H) 01/21/2024 1029   ALKPHOS 109 01/21/2024 1029   BILITOT 0.5 01/21/2024 1029   GFRNONAA 120 11/30/2020 1354   GFRAA 139 11/30/2020 1354    Lab Results  Component Value Date   WBC 6.0 01/21/2024   HGB 15.6 01/21/2024   HCT 46.8 01/21/2024   MCV 87 01/21/2024   PLT 220 01/21/2024   No results found for: HGBA1C Lab Results  Component Value Date   CHOL 168 01/21/2024   HDL 33 (L) 01/21/2024   LDLCALC 105 (H) 01/21/2024   TRIG 167 (H) 01/21/2024   Lab Results  Component Value Date   TSH 1.970 01/21/2024      Assessment and Plan:  On pre-exposure prophylaxis for HIV Assessment & Plan: Refill Truvada. Reviewed STI screen q54m with annual routine labs. Encouraged  safe sex practices including condom use, as Truvada only protects against HIV and is not meant to prevent other diseases.   Orders: -  Emtricitabine -Tenofovir  DF; Take 1 tablet by mouth daily.  Dispense: 90 tablet; Refill: 1  Screening examination for STI -     RPR -     HIV Antibody (routine testing w rflx) -     Comprehensive metabolic panel with GFR -     Ct, Ng, Mycoplasmas NAA, Urine    F/u 3 mo OV Truvada   Rolan Hoyle, PA-C, DMSc, Nutritionist [facility] [phone]

## 2024-08-30 NOTE — Progress Notes (Signed)
 Patient is in office today for a nurse visit for Immunization. Patient Injection was given in the  Left deltoid. Patient tolerated injection well.

## 2024-08-31 DIAGNOSIS — Z113 Encounter for screening for infections with a predominantly sexual mode of transmission: Secondary | ICD-10-CM | POA: Diagnosis not present

## 2024-09-01 LAB — COMPREHENSIVE METABOLIC PANEL WITH GFR
ALT: 95 IU/L — ABNORMAL HIGH (ref 0–44)
AST: 46 IU/L — ABNORMAL HIGH (ref 0–40)
Albumin: 4.9 g/dL (ref 4.3–5.2)
Alkaline Phosphatase: 113 IU/L (ref 47–123)
BUN/Creatinine Ratio: 12 (ref 9–20)
BUN: 12 mg/dL (ref 6–20)
Bilirubin Total: 0.5 mg/dL (ref 0.0–1.2)
CO2: 23 mmol/L (ref 20–29)
Calcium: 9.7 mg/dL (ref 8.7–10.2)
Chloride: 101 mmol/L (ref 96–106)
Creatinine, Ser: 0.99 mg/dL (ref 0.76–1.27)
Globulin, Total: 2.5 g/dL (ref 1.5–4.5)
Glucose: 92 mg/dL (ref 70–99)
Potassium: 4.4 mmol/L (ref 3.5–5.2)
Sodium: 142 mmol/L (ref 134–144)
Total Protein: 7.4 g/dL (ref 6.0–8.5)
eGFR: 106 mL/min/1.73 (ref >59)

## 2024-09-01 LAB — RPR: RPR Ser Ql: NONREACTIVE

## 2024-09-01 LAB — HIV ANTIBODY (ROUTINE TESTING W REFLEX): HIV Screen 4th Generation wRfx: NONREACTIVE

## 2024-09-02 ENCOUNTER — Ambulatory Visit: Payer: Self-pay | Admitting: Physician Assistant

## 2024-09-02 LAB — CT, NG, MYCOPLASMAS NAA, URINE
Chlamydia trachomatis, NAA: NEGATIVE
Mycoplasma genitalium NAA: NEGATIVE
Mycoplasma hominis NAA: NEGATIVE
Neisseria gonorrhoeae, NAA: NEGATIVE
Ureaplasma spp NAA: NEGATIVE

## 2024-12-03 ENCOUNTER — Ambulatory Visit: Admitting: Physician Assistant

## 2025-01-24 ENCOUNTER — Encounter: Admitting: Physician Assistant
